# Patient Record
Sex: Male | Born: 1965 | Race: Black or African American | Hispanic: No | Marital: Single | Smoking: Never smoker
Health system: Southern US, Community
[De-identification: ages and names within clinical notes are randomized; demographics above are authoritative.]

## PROBLEM LIST (undated history)

## (undated) DIAGNOSIS — E119 Type 2 diabetes mellitus without complications: Secondary | ICD-10-CM

## (undated) DIAGNOSIS — I10 Essential (primary) hypertension: Secondary | ICD-10-CM

## (undated) DIAGNOSIS — G4733 Obstructive sleep apnea (adult) (pediatric): Secondary | ICD-10-CM

## (undated) DIAGNOSIS — Z9989 Dependence on other enabling machines and devices: Secondary | ICD-10-CM

---

## 1999-10-24 ENCOUNTER — Ambulatory Visit (HOSPITAL_BASED_OUTPATIENT_CLINIC_OR_DEPARTMENT_OTHER): Admission: RE | Admit: 1999-10-24 | Discharge: 1999-10-24 | Payer: Self-pay | Admitting: Orthopedic Surgery

## 2010-03-02 ENCOUNTER — Emergency Department (HOSPITAL_COMMUNITY): Admission: EM | Admit: 2010-03-02 | Discharge: 2010-03-02 | Payer: Self-pay | Admitting: Emergency Medicine

## 2010-12-19 NOTE — Op Note (Signed)
[place]  Patient:    Adrian Farrell, Adrian Farrell                 MRN: [id number] Proc. Date: 10/24/99 Adm. Date:  [id number] Attending:  Ronne Binning CC:         Nicki Reaper, M.D. (2 copies)                           Operative Report  PREOPERATIVE DIAGNOSIS:  Laceration extensor tendon left index finger.  POSTOPERATIVE DIAGNOSIS:  Laceration extensor tendon left index finger.  OPERATION:  Repair of extensor tendon left index finger.  SURGEON:  Nicki Reaper, M.D.  ANESTHESIA:  Metacarpal block.  HISTORY:  The patient is a 44 year old male who suffered a laceration to the dorsal aspect of the left index finger.  This was repaired [facility] only at [facility]. He is referred for definitive care.  PROCEDURE:  The patient was brought to the operating room.  A metacarpal block given with 1% Xylocaine without epinephrine.  Prepped and draped using Betadine  scrubbing solution.  Penrose drain was used for tourniquet control at the base f the finger.  The sutures were removed.  The laceration to the extensor tendon was immediately apparent.  This was repaired with figure-of-eight 4 0 Mersilene sutures.  The wound was irrigated.  The skin was closed with uninterrupted 5 0 nylon sutures.  A sterile compressive dressing and splint to the finger applied. The patient tolerated the procedure well and was taken to the recovery room for  observation in satisfactory condition.  He is discharged home to return to [facility] in one week on Vicodin and Keflex.  It should be noted that the laceration was just distal to the central ______ insertion on the radial lateral band and a portion of the central ______. DD:  10/24/99 TD:  10/24/99 Job: [id number]

## 2012-08-24 ENCOUNTER — Encounter (HOSPITAL_COMMUNITY): Payer: Self-pay | Admitting: *Deleted

## 2012-08-24 ENCOUNTER — Inpatient Hospital Stay (HOSPITAL_COMMUNITY)
Admission: EM | Admit: 2012-08-24 | Discharge: 2012-08-29 | DRG: 617 | Disposition: A | Discharge status: Home / Self Care | Payer: No Typology Code available for payment source | Attending: Family Medicine | Admitting: Family Medicine

## 2012-08-24 ENCOUNTER — Emergency Department (HOSPITAL_COMMUNITY): Payer: Self-pay

## 2012-08-24 DIAGNOSIS — I96 Gangrene, not elsewhere classified: Secondary | ICD-10-CM | POA: Diagnosis present

## 2012-08-24 DIAGNOSIS — L03116 Cellulitis of left lower limb: Secondary | ICD-10-CM

## 2012-08-24 DIAGNOSIS — T783XXA Angioneurotic edema, initial encounter: Secondary | ICD-10-CM | POA: Diagnosis present

## 2012-08-24 DIAGNOSIS — E11621 Type 2 diabetes mellitus with foot ulcer: Secondary | ICD-10-CM | POA: Diagnosis present

## 2012-08-24 DIAGNOSIS — T46905A Adverse effect of unspecified agents primarily affecting the cardiovascular system, initial encounter: Secondary | ICD-10-CM | POA: Diagnosis present

## 2012-08-24 DIAGNOSIS — M9080 Osteopathy in diseases classified elsewhere, unspecified site: Secondary | ICD-10-CM | POA: Diagnosis present

## 2012-08-24 DIAGNOSIS — E1159 Type 2 diabetes mellitus with other circulatory complications: Secondary | ICD-10-CM | POA: Diagnosis present

## 2012-08-24 DIAGNOSIS — Z6841 Body Mass Index (BMI) 40.0 and over, adult: Secondary | ICD-10-CM

## 2012-08-24 DIAGNOSIS — L97509 Non-pressure chronic ulcer of other part of unspecified foot with unspecified severity: Secondary | ICD-10-CM | POA: Diagnosis present

## 2012-08-24 DIAGNOSIS — E1169 Type 2 diabetes mellitus with other specified complication: Principal | ICD-10-CM | POA: Diagnosis present

## 2012-08-24 DIAGNOSIS — I10 Essential (primary) hypertension: Secondary | ICD-10-CM | POA: Diagnosis present

## 2012-08-24 DIAGNOSIS — Z01810 Encounter for preprocedural cardiovascular examination: Secondary | ICD-10-CM

## 2012-08-24 DIAGNOSIS — M869 Osteomyelitis, unspecified: Secondary | ICD-10-CM | POA: Diagnosis present

## 2012-08-24 DIAGNOSIS — I4891 Unspecified atrial fibrillation: Secondary | ICD-10-CM | POA: Diagnosis present

## 2012-08-24 DIAGNOSIS — T464X5A Adverse effect of angiotensin-converting-enzyme inhibitors, initial encounter: Secondary | ICD-10-CM

## 2012-08-24 HISTORY — DX: Essential (primary) hypertension: I10

## 2012-08-24 HISTORY — DX: Type 2 diabetes mellitus without complications: E11.9

## 2012-08-24 HISTORY — DX: Dependence on other enabling machines and devices: Z99.89

## 2012-08-24 HISTORY — DX: Obstructive sleep apnea (adult) (pediatric): G47.33

## 2012-08-24 LAB — CBC WITH DIFFERENTIAL/PLATELET
Basophils Absolute: 0.1 10*3/uL (ref 0.0–0.1)
Basophils Relative: 0 % (ref 0–1)
HCT: 30.9 % — ABNORMAL LOW (ref 39.0–52.0)
Hemoglobin: 10.2 g/dL — ABNORMAL LOW (ref 13.0–17.0)
Lymphocytes Relative: 17 % (ref 12–46)
MCH: 25.9 pg — ABNORMAL LOW (ref 26.0–34.0)
MCHC: 33 g/dL (ref 30.0–36.0)
Monocytes Absolute: 1 10*3/uL (ref 0.1–1.0)
Platelets: 645 10*3/uL — ABNORMAL HIGH (ref 150–400)
RDW: 14.4 % (ref 11.5–15.5)

## 2012-08-24 LAB — GLUCOSE, CAPILLARY: Glucose-Capillary: 160 mg/dL — ABNORMAL HIGH (ref 70–99)

## 2012-08-24 MED ORDER — PIPERACILLIN-TAZOBACTAM 3.375 G IVPB
3.3750 g | Freq: Once | INTRAVENOUS | Status: AC
  Administered 2012-08-25: 3.375 g via INTRAVENOUS
  Filled 2012-08-24: qty 50

## 2012-08-24 MED ORDER — DIPHENHYDRAMINE HCL 50 MG/ML IJ SOLN
25.0000 mg | Freq: Once | INTRAMUSCULAR | Status: AC
  Administered 2012-08-24: 25 mg via INTRAVENOUS
  Filled 2012-08-24: qty 1

## 2012-08-24 MED ORDER — METHYLPREDNISOLONE SODIUM SUCC 125 MG IJ SOLR
125.0000 mg | Freq: Once | INTRAMUSCULAR | Status: AC
  Administered 2012-08-24: 125 mg via INTRAVENOUS
  Filled 2012-08-24: qty 2

## 2012-08-24 MED ORDER — FAMOTIDINE IN NACL 20-0.9 MG/50ML-% IV SOLN
20.0000 mg | Freq: Once | INTRAVENOUS | Status: AC
  Administered 2012-08-24: 20 mg via INTRAVENOUS
  Filled 2012-08-24: qty 50

## 2012-08-24 MED ORDER — SODIUM CHLORIDE 0.9 % IV BOLUS (SEPSIS)
500.0000 mL | Freq: Once | INTRAVENOUS | Status: AC
  Administered 2012-08-24: 500 mL via INTRAVENOUS

## 2012-08-24 MED ORDER — VANCOMYCIN HCL IN DEXTROSE 1-5 GM/200ML-% IV SOLN
1000.0000 mg | Freq: Once | INTRAVENOUS | Status: AC
  Administered 2012-08-25: 1000 mg via INTRAVENOUS
  Filled 2012-08-24: qty 200

## 2012-08-24 NOTE — ED Notes (Signed)
Pt presents with large amt of swelling to top lip; started this afternoon; no tongue swelling; no respiratory involvement; speaking in full sentences without hoarseness; took last dose of lisinopril this morning

## 2012-08-24 NOTE — ED Notes (Signed)
Pt wife to triage nurse in hallway stating pt does not want nurse or doctor to know about foot ulcer; wife has pictures of pt foot dating 08/07/12 showing ulcerated area at base of toes with redness with toenail that has fallen off; [employer] notified

## 2012-08-24 NOTE — ED Provider Notes (Signed)
History     CSN: [id number]  Arrival date & time 08/24/12  2145   First MD Initiated Contact with Patient 08/24/12 2242      Chief Complaint  Patient presents with  . Allergic Reaction  . foot wound     (Consider location/radiation/quality/duration/timing/severity/associated sxs/prior treatment) HPI Pt with upper lip swelling starting at 1500 today. Pt thinks swelling has worsened since. No tongue swelling or difficulty breathing. Pt has been on Lisinopril for many years.   Pt has also had breakdown of skin between toes of L foot with purulent drainage, increased redness, warmth and swelling up leg to L knee. No fever or chills. No N/V. States his diabetes is well controlled.  Past Medical History  Diagnosis Date  . Hypertension   . Coronary artery disease   . Diabetes mellitus without complication     has complication as of 1/23, diabetic foot ulcer  . OSA on CPAP     History reviewed. No pertinent past surgical history.  History reviewed. No pertinent family history.  History  Substance Use Topics  . Smoking status: Never Smoker   . Smokeless tobacco: Never Used  . Alcohol Use: No      Review of Systems  Constitutional: Negative for fever, chills and fatigue.  HENT: Positive for facial swelling. Negative for sore throat, trouble swallowing, neck pain, neck stiffness and voice change.   Respiratory: Negative for shortness of breath, wheezing and stridor.   Cardiovascular: Negative for chest pain.  Gastrointestinal: Negative for nausea, vomiting, abdominal pain and diarrhea.  Musculoskeletal: Negative for myalgias and back pain.  Skin: Positive for color change and rash.  Neurological: Negative for dizziness, weakness, light-headedness, numbness and headaches.  All other systems reviewed and are negative.    Allergies  Lisinopril  Home Medications   No current outpatient prescriptions on file.  BP 119/95  Pulse 74  Temp 98.6 F (37 C) (Oral)  Resp 20   Ht 6\' 1"  (1.854 m)  Wt 383 lb (173.728 kg)  BMI 50.53 kg/m2  SpO2 99%  Physical Exam  Nursing note and vitals reviewed. Constitutional: He is oriented to person, place, and time. He appears well-developed and well-nourished. No distress.  HENT:  Head: Normocephalic and atraumatic.  Mouth/Throat: Oropharynx is clear and moist. No posterior oropharyngeal edema.       L upper lip swelling. No tongue or intraoral swelling  Eyes: EOM are normal. Pupils are equal, round, and reactive to light.  Neck: Normal range of motion. Neck supple.  Cardiovascular: Normal rate and regular rhythm.   Pulmonary/Chest: Effort normal and breath sounds normal. No stridor. No respiratory distress. He has no wheezes. He has no rales. He exhibits no tenderness.  Abdominal: Soft. Bowel sounds are normal. He exhibits no mass. There is no tenderness. There is no rebound and no guarding.  Musculoskeletal: Normal range of motion. He exhibits edema. He exhibits no tenderness.       Skin breakdown between toes of L foot with purulent drainage. +swelling of L leg and redness and warmth extending up to knee.   Neurological: He is alert and oriented to person, place, and time.       Moves all ext, sensation grossly intact.   Skin: Skin is warm and dry. No rash noted. There is erythema.  Psychiatric: He has a normal mood and affect. His behavior is normal.    ED Course  Procedures (including critical care time)  Labs Reviewed  CBC WITH DIFFERENTIAL - Abnormal;  Notable for the following:    WBC 14.1 (*)     RBC 3.94 (*)     Hemoglobin 10.2 (*)     HCT 30.9 (*)     MCH 25.9 (*)     Platelets 645 (*)     Neutro Abs 10.3 (*)     All other components within normal limits  COMPREHENSIVE METABOLIC PANEL - Abnormal; Notable for the following:    Sodium 132 (*)     Glucose, Bld 165 (*)     Albumin 2.3 (*)     AST 43 (*)     ALT 95 (*)     Total Bilirubin 0.2 (*)     GFR calc non Af Amer 72 (*)     GFR calc Af Amer  83 (*)     All other components within normal limits  GLUCOSE, CAPILLARY - Abnormal; Notable for the following:    Glucose-Capillary 160 (*)     All other components within normal limits  CBC - Abnormal; Notable for the following:    WBC 15.3 (*)     RBC 4.01 (*)     Hemoglobin 10.1 (*)     HCT 31.3 (*)     MCH 25.2 (*)     Platelets 652 (*)     All other components within normal limits  BASIC METABOLIC PANEL - Abnormal; Notable for the following:    Sodium 129 (*)     Glucose, Bld 340 (*)     All other components within normal limits  GLUCOSE, CAPILLARY - Abnormal; Notable for the following:    Glucose-Capillary 310 (*)     All other components within normal limits  GLUCOSE, CAPILLARY - Abnormal; Notable for the following:    Glucose-Capillary 304 (*)     All other components within normal limits  HEMOGLOBIN A1C - Abnormal; Notable for the following:    Hemoglobin A1C 9.1 (*)     Mean Plasma Glucose 214 (*)     All other components within normal limits  GLUCOSE, CAPILLARY - Abnormal; Notable for the following:    Glucose-Capillary 265 (*)     All other components within normal limits  CULTURE, BLOOD (ROUTINE X 2)  CULTURE, BLOOD (ROUTINE X 2)   Dg Foot Complete Left  08/24/2012  *RADIOLOGY REPORT*  Clinical Data: Ulcerations at the top of the left foot.  LEFT FOOT - COMPLETE 3+ VIEW  Comparison: None.  Findings: Three views of the left foot were obtained.  There is lucency and bone loss in the fourth toe proximal phalanx.  There is also marked lucency involving the fourth toe middle phalanx.  There appears to be a dislocation or subluxation involving the fourth toe PIP joint.  The fourth toe is poorly visualized on the lateral view.  There is a prominent spur involving the plantar aspect of the calcaneus.  There appears to be soft tissue edema within the foot.  IMPRESSION: There is lucency and bone loss involving the fourth toe proximal phalanx and middle phalanx.  There is  probably a dislocation at the fourth toe PIP joint.  These findings are concerning for osteomyelitis.   Original Report Authenticated By: Richarda Overlie, M.D.      1. ACE inhibitor-aggravated angioedema   2. Osteomyelitis of toe of left foot   3. Cellulitis of left lower leg   4. Diabetic foot ulcer with osteomyelitis       MDM  Loren Racer, MD 08/25/12 1459

## 2012-08-24 NOTE — ED Provider Notes (Signed)
Care assumed from Dr. Ranae Palms. Patient with history of diabetes, hypertension, coronary disease. Patient with angioedema to upper lip, is on lisinopril. No prior history of angioedema no oropharynx involvement. Patient also with wound to left fourth toe with associated cellulitis to midcalf. Labs and x-ray were pending. X-ray shows osteomyelitis. Patient receiving antibiotics. Will need admission.  Results for orders placed during the hospital encounter of 08/24/12  CBC WITH DIFFERENTIAL      Component Value Range   WBC 14.1 (*) 4.0 - 10.5 K/uL   RBC 3.94 (*) 4.22 - 5.81 MIL/uL   Hemoglobin 10.2 (*) 13.0 - 17.0 g/dL   HCT 16.1 (*) 09.6 - 04.5 %   MCV 78.4  78.0 - 100.0 fL   MCH 25.9 (*) 26.0 - 34.0 pg   MCHC 33.0  30.0 - 36.0 g/dL   RDW 40.9  81.1 - 91.4 %   Platelets 645 (*) 150 - 400 K/uL   Neutrophils Relative 73  43 - 77 %   Neutro Abs 10.3 (*) 1.7 - 7.7 K/uL   Lymphocytes Relative 17  12 - 46 %   Lymphs Abs 2.4  0.7 - 4.0 K/uL   Monocytes Relative 7  3 - 12 %   Monocytes Absolute 1.0  0.1 - 1.0 K/uL   Eosinophils Relative 2  0 - 5 %   Eosinophils Absolute 0.3  0.0 - 0.7 K/uL   Basophils Relative 0  0 - 1 %   Basophils Absolute 0.1  0.0 - 0.1 K/uL  COMPREHENSIVE METABOLIC PANEL      Component Value Range   Sodium 132 (*) 135 - 145 mEq/L   Potassium 3.7  3.5 - 5.1 mEq/L   Chloride 97  96 - 112 mEq/L   CO2 24  19 - 32 mEq/L   Glucose, Bld 165 (*) 70 - 99 mg/dL   BUN 13  6 - 23 mg/dL   Creatinine, Ser 7.82  0.50 - 1.35 mg/dL   Calcium 8.8  8.4 - 95.6 mg/dL   Total Protein 7.2  6.0 - 8.3 g/dL   Albumin 2.3 (*) 3.5 - 5.2 g/dL   AST 43 (*) 0 - 37 U/L   ALT 95 (*) 0 - 53 U/L   Alkaline Phosphatase 110  39 - 117 U/L   Total Bilirubin 0.2 (*) 0.3 - 1.2 mg/dL   GFR calc non Af Amer 72 (*) >90 mL/min   GFR calc Af Amer 83 (*) >90 mL/min  GLUCOSE, CAPILLARY      Component Value Range   Glucose-Capillary 160 (*) 70 - 99 mg/dL   Comment 1 Documented in Chart     Comment 2 Notify  RN    CBC      Component Value Range   WBC 15.3 (*) 4.0 - 10.5 K/uL   RBC 4.01 (*) 4.22 - 5.81 MIL/uL   Hemoglobin 10.1 (*) 13.0 - 17.0 g/dL   HCT 21.3 (*) 08.6 - 57.8 %   MCV 78.1  78.0 - 100.0 fL   MCH 25.2 (*) 26.0 - 34.0 pg   MCHC 32.3  30.0 - 36.0 g/dL   RDW 46.9  62.9 - 52.8 %   Platelets 652 (*) 150 - 400 K/uL  BASIC METABOLIC PANEL      Component Value Range   Sodium 129 (*) 135 - 145 mEq/L   Potassium 4.9  3.5 - 5.1 mEq/L   Chloride 96  96 - 112 mEq/L   CO2 21  19 -  32 mEq/L   Glucose, Bld 340 (*) 70 - 99 mg/dL   BUN 15  6 - 23 mg/dL   Creatinine, Ser 6.21  0.50 - 1.35 mg/dL   Calcium 8.9  8.4 - 30.8 mg/dL   GFR calc non Af Amer >90  >90 mL/min   GFR calc Af Amer >90  >90 mL/min   Dg Foot Complete Left  08/24/2012  *RADIOLOGY REPORT*  Clinical Data: Ulcerations at the top of the left foot.  LEFT FOOT - COMPLETE 3+ VIEW  Comparison: None.  Findings: Three views of the left foot were obtained.  There is lucency and bone loss in the fourth toe proximal phalanx.  There is also marked lucency involving the fourth toe middle phalanx.  There appears to be a dislocation or subluxation involving the fourth toe PIP joint.  The fourth toe is poorly visualized on the lateral view.  There is a prominent spur involving the plantar aspect of the calcaneus.  There appears to be soft tissue edema within the foot.  IMPRESSION: There is lucency and bone loss involving the fourth toe proximal phalanx and middle phalanx.  There is probably a dislocation at the fourth toe PIP joint.  These findings are concerning for osteomyelitis.   Original Report Authenticated By: Richarda Overlie, M.D.       Olivia Mackie, MD 08/25/12 [phone]

## 2012-08-25 ENCOUNTER — Encounter (HOSPITAL_COMMUNITY)
Admission: EM | Disposition: A | Discharge status: Home / Self Care | Payer: Self-pay | Source: Home / Self Care | Attending: Internal Medicine

## 2012-08-25 ENCOUNTER — Encounter (HOSPITAL_COMMUNITY): Payer: Self-pay | Admitting: Internal Medicine

## 2012-08-25 DIAGNOSIS — T783XXA Angioneurotic edema, initial encounter: Secondary | ICD-10-CM

## 2012-08-25 DIAGNOSIS — E11621 Type 2 diabetes mellitus with foot ulcer: Secondary | ICD-10-CM | POA: Diagnosis present

## 2012-08-25 DIAGNOSIS — T465X5A Adverse effect of other antihypertensive drugs, initial encounter: Secondary | ICD-10-CM

## 2012-08-25 DIAGNOSIS — I96 Gangrene, not elsewhere classified: Secondary | ICD-10-CM

## 2012-08-25 LAB — COMPREHENSIVE METABOLIC PANEL
Albumin: 2.3 g/dL — ABNORMAL LOW (ref 3.5–5.2)
BUN: 13 mg/dL (ref 6–23)
CO2: 24 mEq/L (ref 19–32)
Calcium: 8.8 mg/dL (ref 8.4–10.5)
Chloride: 97 mEq/L (ref 96–112)
GFR calc Af Amer: 83 mL/min — ABNORMAL LOW (ref >90)
GFR calc non Af Amer: 72 mL/min — ABNORMAL LOW (ref >90)
Glucose, Bld: 165 mg/dL — ABNORMAL HIGH (ref 70–99)
Potassium: 3.7 mEq/L (ref 3.5–5.1)

## 2012-08-25 LAB — GLUCOSE, CAPILLARY
Glucose-Capillary: 310 mg/dL — ABNORMAL HIGH (ref 70–99)
Glucose-Capillary: 304 mg/dL — ABNORMAL HIGH (ref 70–99)

## 2012-08-25 LAB — CBC
MCH: 25.2 pg — ABNORMAL LOW (ref 26.0–34.0)
Platelets: 652 10*3/uL — ABNORMAL HIGH (ref 150–400)
RBC: 4.01 MIL/uL — ABNORMAL LOW (ref 4.22–5.81)

## 2012-08-25 LAB — BASIC METABOLIC PANEL
CO2: 21 mEq/L (ref 19–32)
Calcium: 8.9 mg/dL (ref 8.4–10.5)
GFR calc non Af Amer: >90 mL/min (ref >90)
Potassium: 4.9 mEq/L (ref 3.5–5.1)
Sodium: 129 mEq/L — ABNORMAL LOW (ref 135–145)

## 2012-08-25 SURGERY — AMPUTATION DIGIT
Anesthesia: General | Site: Toe | Laterality: Left

## 2012-08-25 MED ORDER — DILTIAZEM HCL 25 MG/5ML IV SOLN
10.0000 mg | Freq: Three times a day (TID) | INTRAVENOUS | Status: DC | PRN
  Administered 2012-08-25: 10 mg via INTRAVENOUS
  Filled 2012-08-25: qty 5

## 2012-08-25 MED ORDER — POTASSIUM GLUCONATE 595 (99 K) MG PO TABS
595.0000 mg | ORAL_TABLET | Freq: Every day | ORAL | Status: DC
  Administered 2012-08-25 – 2012-08-26 (×2): 595 mg via ORAL
  Filled 2012-08-25 (×2): qty 1

## 2012-08-25 MED ORDER — VANCOMYCIN HCL 10 G IV SOLR
1500.0000 mg | Freq: Two times a day (BID) | INTRAVENOUS | Status: DC
  Administered 2012-08-25 – 2012-08-27 (×6): 1500 mg via INTRAVENOUS
  Filled 2012-08-25 (×8): qty 1500

## 2012-08-25 MED ORDER — INSULIN ASPART PROT & ASPART (70-30 MIX) 100 UNIT/ML ~~LOC~~ SUSP
40.0000 [IU] | Freq: Two times a day (BID) | SUBCUTANEOUS | Status: DC
  Administered 2012-08-25 – 2012-08-29 (×7): 40 [IU] via SUBCUTANEOUS
  Filled 2012-08-25: qty 3

## 2012-08-25 MED ORDER — INSULIN ASPART 100 UNIT/ML ~~LOC~~ SOLN
0.0000 [IU] | Freq: Three times a day (TID) | SUBCUTANEOUS | Status: DC
  Administered 2012-08-25: 8 [IU] via SUBCUTANEOUS
  Administered 2012-08-26: 11 [IU] via SUBCUTANEOUS
  Administered 2012-08-26: 3 [IU] via SUBCUTANEOUS
  Administered 2012-08-27: 8 [IU] via SUBCUTANEOUS
  Administered 2012-08-27: 2 [IU] via SUBCUTANEOUS
  Administered 2012-08-28 (×3): 3 [IU] via SUBCUTANEOUS
  Administered 2012-08-29: 2 [IU] via SUBCUTANEOUS
  Administered 2012-08-29: 8 [IU] via SUBCUTANEOUS
  Filled 2012-08-25 (×2): qty 1

## 2012-08-25 MED ORDER — METFORMIN HCL 500 MG PO TABS
500.0000 mg | ORAL_TABLET | Freq: Two times a day (BID) | ORAL | Status: DC
  Administered 2012-08-25: 500 mg via ORAL
  Filled 2012-08-25 (×5): qty 1

## 2012-08-25 MED ORDER — DILTIAZEM HCL 100 MG IV SOLR
5.0000 mg/h | INTRAVENOUS | Status: DC
  Administered 2012-08-25: 10 mg/h via INTRAVENOUS
  Filled 2012-08-25 (×2): qty 100

## 2012-08-25 MED ORDER — HEPARIN SODIUM (PORCINE) 5000 UNIT/ML IJ SOLN
5000.0000 [IU] | Freq: Three times a day (TID) | INTRAMUSCULAR | Status: DC
  Administered 2012-08-25 – 2012-08-29 (×12): 5000 [IU] via SUBCUTANEOUS
  Filled 2012-08-25 (×17): qty 1

## 2012-08-25 MED ORDER — METOPROLOL TARTRATE 12.5 MG HALF TABLET: 12.5000 mg | ORAL_TABLET | Freq: Two times a day (BID) | ORAL | Status: DC

## 2012-08-25 MED ORDER — POTASSIUM GLUCONATE 550 MG PO TABS: 1.0000 | ORAL_TABLET | Freq: Every day | ORAL | Status: DC

## 2012-08-25 MED ORDER — DILTIAZEM LOAD VIA INFUSION
10.0000 mg | Freq: Once | INTRAVENOUS | Status: AC
  Administered 2012-08-25: 10 mg via INTRAVENOUS
  Filled 2012-08-25: qty 10

## 2012-08-25 MED ORDER — PIPERACILLIN-TAZOBACTAM 3.375 G IVPB
3.3750 g | Freq: Three times a day (TID) | INTRAVENOUS | Status: DC
  Administered 2012-08-25 – 2012-08-28 (×8): 3.375 g via INTRAVENOUS
  Filled 2012-08-25 (×12): qty 50

## 2012-08-25 MED ORDER — FUROSEMIDE 40 MG PO TABS
40.0000 mg | ORAL_TABLET | Freq: Every day | ORAL | Status: DC
  Administered 2012-08-25 – 2012-08-29 (×5): 40 mg via ORAL
  Filled 2012-08-25 (×5): qty 1

## 2012-08-25 MED ORDER — INSULIN ASPART 100 UNIT/ML ~~LOC~~ SOLN
0.0000 [IU] | Freq: Once | SUBCUTANEOUS | Status: AC
  Administered 2012-08-25: 11 [IU] via SUBCUTANEOUS

## 2012-08-25 MED ORDER — METOPROLOL TARTRATE 12.5 MG HALF TABLET
12.5000 mg | ORAL_TABLET | Freq: Two times a day (BID) | ORAL | Status: DC
  Administered 2012-08-25 – 2012-08-26 (×2): 12.5 mg via ORAL
  Filled 2012-08-25 (×4): qty 1

## 2012-08-25 MED ORDER — LORAZEPAM 2 MG/ML IJ SOLN: 1.0000 mg | INTRAMUSCULAR | Status: DC | PRN

## 2012-08-25 MED ORDER — PIPERACILLIN-TAZOBACTAM 3.375 G IVPB: 3.3750 g | Freq: Four times a day (QID) | INTRAVENOUS | Status: DC

## 2012-08-25 MED ORDER — POTASSIUM GLUCONATE 550 MG PO TABS
1.0000 | ORAL_TABLET | Freq: Every day | ORAL | Status: DC
  Filled 2012-08-25: qty 1

## 2012-08-25 NOTE — Consult Note (Signed)
Reason for Consult:left foot toe infection / osteomyelitis Referring Physician:Gardner  Adrian Farrell is an 47 y.o. male.  HPI: 47 year old male with skin breakdown of left 3rd thru 5th toes for 2 weeks which we were consulted for by Dr. Julian Reil. Patient reports DM II for 15 years . Last meal at noon today.  Past Medical History  Diagnosis Date  . Hypertension   . Coronary artery disease   . Diabetes mellitus without complication     has complication as of 1/23, diabetic foot ulcer  . OSA on CPAP     History reviewed. No pertinent past surgical history.  History reviewed. No pertinent family history.  Social History:  reports that he has never smoked. He has never used smokeless tobacco. He reports that he does not drink alcohol or use illicit drugs.  Allergies:  Allergies  Allergen Reactions  . Lisinopril Swelling    Angioedema    Medications: I have reviewed the patient's current medications.  Results for orders placed during the hospital encounter of 08/24/12 (from the past 48 hour(s))  CBC WITH DIFFERENTIAL     Status: Abnormal   Collection Time   08/24/12 10:52 PM      Component Value Range Comment   WBC 14.1 (*) 4.0 - 10.5 K/uL    RBC 3.94 (*) 4.22 - 5.81 MIL/uL    Hemoglobin 10.2 (*) 13.0 - 17.0 g/dL    HCT 16.1 (*) 09.6 - 52.0 %    MCV 78.4  78.0 - 100.0 fL    MCH 25.9 (*) 26.0 - 34.0 pg    MCHC 33.0  30.0 - 36.0 g/dL    RDW 04.5  40.9 - 81.1 %    Platelets 645 (*) 150 - 400 K/uL    Neutrophils Relative 73  43 - 77 %    Neutro Abs 10.3 (*) 1.7 - 7.7 K/uL    Lymphocytes Relative 17  12 - 46 %    Lymphs Abs 2.4  0.7 - 4.0 K/uL    Monocytes Relative 7  3 - 12 %    Monocytes Absolute 1.0  0.1 - 1.0 K/uL    Eosinophils Relative 2  0 - 5 %    Eosinophils Absolute 0.3  0.0 - 0.7 K/uL    Basophils Relative 0  0 - 1 %    Basophils Absolute 0.1  0.0 - 0.1 K/uL   COMPREHENSIVE METABOLIC PANEL     Status: Abnormal   Collection Time   08/24/12 10:52 PM   Component Value Range Comment   Sodium 132 (*) 135 - 145 mEq/L    Potassium 3.7  3.5 - 5.1 mEq/L    Chloride 97  96 - 112 mEq/L    CO2 24  19 - 32 mEq/L    Glucose, Bld 165 (*) 70 - 99 mg/dL    BUN 13  6 - 23 mg/dL    Creatinine, Ser 9.14  0.50 - 1.35 mg/dL    Calcium 8.8  8.4 - 78.2 mg/dL    Total Protein 7.2  6.0 - 8.3 g/dL    Albumin 2.3 (*) 3.5 - 5.2 g/dL    AST 43 (*) 0 - 37 U/L    ALT 95 (*) 0 - 53 U/L    Alkaline Phosphatase 110  39 - 117 U/L    Total Bilirubin 0.2 (*) 0.3 - 1.2 mg/dL    GFR calc non Af Amer 72 (*) >90 mL/min    GFR calc  Af Amer 83 (*) >90 mL/min   GLUCOSE, CAPILLARY     Status: Abnormal   Collection Time   08/24/12 11:52 PM      Component Value Range Comment   Glucose-Capillary 160 (*) 70 - 99 mg/dL    Comment 1 Documented in Chart      Comment 2 Notify RN     CBC     Status: Abnormal   Collection Time   08/25/12  5:09 AM      Component Value Range Comment   WBC 15.3 (*) 4.0 - 10.5 K/uL    RBC 4.01 (*) 4.22 - 5.81 MIL/uL    Hemoglobin 10.1 (*) 13.0 - 17.0 g/dL    HCT 16.1 (*) 09.6 - 52.0 %    MCV 78.1  78.0 - 100.0 fL    MCH 25.2 (*) 26.0 - 34.0 pg    MCHC 32.3  30.0 - 36.0 g/dL    RDW 04.5  40.9 - 81.1 %    Platelets 652 (*) 150 - 400 K/uL   BASIC METABOLIC PANEL     Status: Abnormal   Collection Time   08/25/12  5:09 AM      Component Value Range Comment   Sodium 129 (*) 135 - 145 mEq/L    Potassium 4.9  3.5 - 5.1 mEq/L    Chloride 96  96 - 112 mEq/L    CO2 21  19 - 32 mEq/L    Glucose, Bld 340 (*) 70 - 99 mg/dL    BUN 15  6 - 23 mg/dL    Creatinine, Ser 9.14  0.50 - 1.35 mg/dL    Calcium 8.9  8.4 - 78.2 mg/dL    GFR calc non Af Amer >90  >90 mL/min    GFR calc Af Amer >90  >90 mL/min   HEMOGLOBIN A1C     Status: Abnormal   Collection Time   08/25/12  5:09 AM      Component Value Range Comment   Hemoglobin A1C 9.1 (*) <5.7 %    Mean Plasma Glucose 214 (*) <117 mg/dL   GLUCOSE, CAPILLARY     Status: Abnormal   Collection Time   08/25/12   6:41 AM      Component Value Range Comment   Glucose-Capillary 310 (*) 70 - 99 mg/dL   GLUCOSE, CAPILLARY     Status: Abnormal   Collection Time   08/25/12  8:44 AM      Component Value Range Comment   Glucose-Capillary 304 (*) 70 - 99 mg/dL    Comment 1 Documented in Chart      Comment 2 Notify RN     GLUCOSE, CAPILLARY     Status: Abnormal   Collection Time   08/25/12 12:14 PM      Component Value Range Comment   Glucose-Capillary 265 (*) 70 - 99 mg/dL     Dg Foot Complete Left  08/24/2012  *RADIOLOGY REPORT*  Clinical Data: Ulcerations at the top of the left foot.  LEFT FOOT - COMPLETE 3+ VIEW  Comparison: None.  Findings: Three views of the left foot were obtained.  There is lucency and bone loss in the fourth toe proximal phalanx.  There is also marked lucency involving the fourth toe middle phalanx.  There appears to be a dislocation or subluxation involving the fourth toe PIP joint.  The fourth toe is poorly visualized on the lateral view.  There is a prominent spur involving the plantar aspect of the  calcaneus.  There appears to be soft tissue edema within the foot.  IMPRESSION: There is lucency and bone loss involving the fourth toe proximal phalanx and middle phalanx.  There is probably a dislocation at the fourth toe PIP joint.  These findings are concerning for osteomyelitis.   Original Report Authenticated By: Richarda Overlie, M.D.     Review of Systems  Constitutional: Negative.   HENT: Negative.   Eyes: Negative.   Respiratory: Negative.   Cardiovascular: Negative.   Gastrointestinal: Negative.   Genitourinary: Negative.   Skin:       Skin break down between left 3rd,4th and 5th toes  Psychiatric/Behavioral: Negative.    Blood pressure 119/95, pulse 74, temperature 98.6 F (37 C), temperature source Oral, resp. rate 20, height 6\' 1"  (1.854 m), weight 173.728 kg (383 lb), SpO2 99.00%. Physical Exam  Constitutional: He is oriented to person, place, and time. He appears  well-developed and well-nourished.  HENT:  Head: Normocephalic.  Eyes: EOM are normal.  Cardiovascular: Normal rate and intact distal pulses.        Venous changes bilateral lower legs left greater than and right.   Respiratory: Effort normal.  Neurological: He is alert and oriented to person, place, and time.       Left foot sensation grossly intact to light touch  Skin:       Maceration between 3rd and 4th  web spaces . Purulent drainage around 4th toe. Ulceration medial aspect of left 5th toe no drainage. Mid left leg with lateral ulceration with serous drainage. Erythema and warmth left lower leg.    Assessment/Plan: Diabetes II for 15 yrs  Years with Hgb A1c of 9.1. Left foot skin breakdown for 2 weeks now with osteomyelitis 4th toe on radiographs. Currently on Zosyn and vancomycin. Plan amputation of Left 4th toe thru MTP joint later today by DR.Lestine Box. Patient seen and evaluated by Dr. Lestine Box. NPO since 1250 hrs    Richardean Canal 08/25/2012, 2:51 PM

## 2012-08-25 NOTE — Progress Notes (Signed)
Patient seen and admitted earlier this AM by my associate.  Please review the H and P for further details regarding the Assessment and Plan.  This AM I have placed consult to ortho for further recommendations and I would like to thank Dr. Lestine Box for his help and advice regarding this case.  We will f/u with his recommendations.  - ordered ankle brachial index.  Gurney Balthazor  Possible operation this evening.  Patient has had some anxiety as a result of possible operation and as such order will be placed for Ativan.

## 2012-08-25 NOTE — ED Notes (Signed)
Report called to Bernette Redbird, RN, on 3W. Pt and wife aware of transfer to room 1345 and agreeable.  Pt had small amount of purulent drainage from between toes 3-4 and toes 4-5. Cleansed w/ NSS, dried well, and 4x4 gauze placed between toes.

## 2012-08-25 NOTE — Progress Notes (Signed)
VASCULAR LAB PRELIMINARY  ARTERIAL  ABI completed:    RIGHT    LEFT    PRESSURE WAVEFORM  PRESSURE WAVEFORM  BRACHIAL 132 Triphasic BRACHIAL 129 Triphasic  DP 144 Biphasic DP 123 Biphasic         PT 148 Biphasic PT 127 Biphasic                  RIGHT LEFT  ABI 1.12 0.96   ABIs and Doppler waveforms are within normal limits bilaterally at rst.  Sherrol Vicars, RVS 08/25/2012, 3:04 PM

## 2012-08-25 NOTE — H&P (Signed)
[facility] History and Physical  Adrian Farrell OZH:[id number] DOB: March 12, 1966 DOA: 08/24/2012  Referring physician: ED PCP: No primary provider on file.  Specialists: None  Chief Complaint: Angioedema, L foot ulcer  HPI: Adrian Farrell is a 47 y.o. male who presents to the ED with 2 complaints:  The first is upper lip swelling that onset at 1500 today progressively worsening since onset.  This occurs in the context of many years of lisinopril.  There is no associated tongue swelling nor difficulty breathing.  The second issue which he is very reluctant (to the point of apparently trying to hide it) to talk about, is severe breakdown of the skin between the toes of his L foot, diabetic foot ulcer, and apparent gangrene of the toes.  He states this is not painful, further history taken from his wife reveals this has been going on for at least 2 weeks, it occurs in the context of poorly controlled DM2 at home.  In the ED, X ray exam revealed osteomyelitis of the 4th toe.  Hospitalist has been asked to admit the patient, who is very hesitant to further discuss need for toe amputation at this time.  Review of Systems: 12 systems reviewed and otherwise negative.  Past Medical History  Diagnosis Date  . Hypertension   . Coronary artery disease   . Diabetes mellitus without complication    History reviewed. No pertinent past surgical history. Social History:  reports that he has never smoked. He does not have any smokeless tobacco history on file. He reports that he does not drink alcohol. His drug history not on file.   Allergies  Allergen Reactions  . Lisinopril Swelling    Angioedema    No family history on file. Father is alive, will be here to see patient and "talk some sense into him" per patients wife.   Prior to Admission medications   Medication Sig Start Date End Date Taking? Authorizing Provider  furosemide (LASIX) 40 MG tablet Take 40 mg by mouth daily.    Yes Historical Provider, MD  insulin aspart protamine-insulin aspart (NOVOLOG 70/30) (70-30) 100 UNIT/ML injection Inject 40 Units into the skin 2 (two) times daily with a meal.   Yes Historical Provider, MD  metFORMIN (GLUCOPHAGE) 500 MG tablet Take 500 mg by mouth 2 (two) times daily with a meal.   Yes Historical Provider, MD  Potassium Gluconate 550 MG TABS Take 1 tablet by mouth daily.   Yes Historical Provider, MD  sulfamethoxazole-trimethoprim (BACTRIM DS,SEPTRA DS) 800-160 MG per tablet Take 1 tablet by mouth 2 (two) times daily.   Yes Historical Provider, MD   Physical Exam: Filed Vitals:   08/24/12 2203 08/24/12 2354 08/25/12 0053  BP: 137/95 104/59 149/65  Pulse: 71 92 115  Temp: 98.7 F (37.1 C) 98.6 F (37 C)   TempSrc:  Oral   Resp: 20 19 18   SpO2: 98% 100% 98%    General:  NAD, resting comfortably in bed Eyes: PEERLA EOMI ENT: mucous membranes moist, no tongue swelling, has swelling localized to the upper lip Neck: supple w/o JVD Cardiovascular: RRR w/o MRG Respiratory: CTA B Abdomen: soft, nt, nd, bs+ Skin: skin breakdown between toes of L foot, purulent drainage, redness and warmth extending up to the knee, necrosis of the skin over the 3-5th toes with gross gangrene on examination Musculoskeletal: MAE, full ROM all 4 extremities Psychiatric: normal tone and affect Neurologic: AAOx3, grossly non-focal  Labs on Admission:  Basic Metabolic Panel:  Lab 08/24/12 2252  NA 132*  K 3.7  CL 97  CO2 24  GLUCOSE 165*  BUN 13  CREATININE 1.18  CALCIUM 8.8  MG --  PHOS --   Liver Function Tests:  Lab 08/24/12 2252  AST 43*  ALT 95*  ALKPHOS 110  BILITOT 0.2*  PROT 7.2  ALBUMIN 2.3*   No results found for this basename: LIPASE:5,AMYLASE:5 in the last 168 hours No results found for this basename: AMMONIA:5 in the last 168 hours CBC:  Lab 08/24/12 2252  WBC 14.1*  NEUTROABS 10.3*  HGB 10.2*  HCT 30.9*  MCV 78.4  PLT 645*   Cardiac Enzymes: No  results found for this basename: CKTOTAL:5,CKMB:5,CKMBINDEX:5,TROPONINI:5 in the last 168 hours  BNP (last 3 results) No results found for this basename: PROBNP:3 in the last 8760 hours CBG:  Lab 08/24/12 2352  GLUCAP 160*    Radiological Exams on Admission: Dg Foot Complete Left  08/24/2012  *RADIOLOGY REPORT*  Clinical Data: Ulcerations at the top of the left foot.  LEFT FOOT - COMPLETE 3+ VIEW  Comparison: None.  Findings: Three views of the left foot were obtained.  There is lucency and bone loss in the fourth toe proximal phalanx.  There is also marked lucency involving the fourth toe middle phalanx.  There appears to be a dislocation or subluxation involving the fourth toe PIP joint.  The fourth toe is poorly visualized on the lateral view.  There is a prominent spur involving the plantar aspect of the calcaneus.  There appears to be soft tissue edema within the foot.  IMPRESSION: There is lucency and bone loss involving the fourth toe proximal phalanx and middle phalanx.  There is probably a dislocation at the fourth toe PIP joint.  These findings are concerning for osteomyelitis.   Original Report Authenticated By: Richarda Overlie, M.D.     EKG: Independently reviewed.  Assessment/Plan Principal Problem:  *Diabetic foot ulcer with osteomyelitis Active Problems:  Angioedema   1. Diabetic foot ulcer, gangrene, and osteomyelitis - Thankfully other than his WBC elevation no systemic signs nor symptoms of infection, will put on emperic zosyn and vancomycin to prevent systemic spread for now.  Unfortunately healing prognosis seems poor, the patient has frank osteomyelitis of the 4th toe on plain film X-ray, and gross necrotic gangrene of the 3rd, 4th, and 5th toes on exam.  I doubt that there is adequate blood supply to these areas to resolve the infection even with ABX much less wound healing.  Likely needs orthopaedic consultation and (if he consents) debridement / amputation.  Wife has been  trying to get him to come to the hospital for the past 2 weeks, patient does not want to talk about this though he does not specifically forbid me from talking to his wife about this.  Apparently per the wife, she will contact his parents and she feels that his father will be successful in convincing him regarding the need for amputation. 2. Angioedema - continue to monitor as inpatient with continuous pulse ox, have stopped his lisinopril as well and added to allergies list. 3. DM2 - continue home regimen for now with AC/HS CBG checks, likely will need modification but unclear how just yet, on 70/30 and metformin at home.  .  Code Status: Full Code (must indicate code status--if unknown or must be presumed, indicate so) Family Communication: Spoke with wife and patient (patient did not forbid me to talk to wife) (indicate person spoken with, if applicable,  with phone number if by telephone) Disposition Plan: Admit to inpatient (indicate anticipated LOS)  Time spent: 70 min  GARDNER, JARED M. [facility] Pager [phone]  If 7PM-7AM, please contact night-coverage [email or web address] Password [email or web address] 08/25/2012, 1:40 AM

## 2012-08-25 NOTE — Progress Notes (Signed)
ANTIBIOTIC CONSULT NOTE - INITIAL  Pharmacy Consult for Vancomycin Indication: Diabetic Foot ulcer/Osteo 4th toe/Gangrene 3rd, 4th and 5th toe   Allergies  Allergen Reactions  . Lisinopril Swelling    Angioedema    Patient Measurements: Height: 6\' 1"  (185.4 cm) Weight: 378 lb (171.46 kg) IBW/kg (Calculated) : 79.9    Vital Signs: Temp: 98.6 F (37 C) (01/22 2354) Temp src: Oral (01/22 2354) BP: 128/84 mmHg (01/23 0643) Pulse Rate: 76  (01/23 0643) Intake/Output from previous day:   Intake/Output from this shift:    Labs:  [facility] 08/25/12 0509 08/24/12 2252  WBC 15.3* 14.1*  HGB 10.1* 10.2*  PLT 652* 645*  LABCREA -- --  CREATININE 0.93 1.18   Estimated Creatinine Clearance: 161.8 ml/min (by C-G formula based on Cr of 0.93). No results found for this basename: VANCOTROUGH:2,VANCOPEAK:2,VANCORANDOM:2,GENTTROUGH:2,GENTPEAK:2,GENTRANDOM:2,TOBRATROUGH:2,TOBRAPEAK:2,TOBRARND:2,AMIKACINPEAK:2,AMIKACINTROU:2,AMIKACIN:2, in the last 72 hours   Microbiology: No results found for this or any previous visit (from the past 720 hour(s)).  Medical History: Past Medical History  Diagnosis Date  . Hypertension   . Coronary artery disease   . Diabetes mellitus without complication     has complication as of 1/23, diabetic foot ulcer  . OSA on CPAP     Medications:  Scheduled:    . [COMPLETED] diphenhydrAMINE  25 mg Intravenous Once  . furosemide  40 mg Oral Daily  . heparin  5,000 Units Subcutaneous Q8H  . insulin aspart protamine-insulin aspart  40 Units Subcutaneous BID WC  . metFORMIN  500 mg Oral BID WC  . [COMPLETED] methylPREDNISolone (SOLU-MEDROL) injection  125 mg Intravenous Once  . Potassium Gluconate  1 tablet Oral Daily  . [COMPLETED] vancomycin  1,000 mg Intravenous Once  . [DISCONTINUED] Potassium Gluconate  1 tablet Oral Daily   Infusions:    . [COMPLETED] famotidine (PEPCID) IV Stopped (08/25/12 0006)  . [COMPLETED] piperacillin-tazobactam (ZOSYN)   IV Stopped (08/25/12 0126)  . piperacillin-tazobactam (ZOSYN)  IV 3.375 g (08/25/12 0634)  . [COMPLETED] sodium chloride Stopped (08/25/12 0007)  . [DISCONTINUED] piperacillin-tazobactam (ZOSYN)  IV     Assessment: 47 yo male admitted c/o angioedema (on lisinopril many years).  Pt found to have diabetic foot ulcer, gangrene and osteomyelitis.  MD ordering Vancomycin per Rx.  Goal of Therapy:  Vancomycin trough level 15-20 mcg/ml  Plan:   Vancomycin 1500mg  q12h. CrCl~ 79 (N)  Pt had 1Gm ~ 0130 in ER- will go ahead and start maintenance dose @ 0800.  F/U SCr/levels as needed.  Susanne Greenhouse R 08/25/2012,6:50 AM

## 2012-08-25 NOTE — Progress Notes (Signed)
Patient having some anxiety with anticipation of surgery.  Patient's pulse elevated at 140.  When patient offered ativan he stated that he had since calmed down.  Heart rate came down to 89.  Patient told to let staff know, if he stated to feel anxious again.  Philomena Doheny RN

## 2012-08-25 NOTE — ED Notes (Signed)
Admitting MD at bedside.

## 2012-08-25 NOTE — Progress Notes (Signed)
Patient's pre-op ekg showed atrial fibrillation with pulse of 145.  Patient's heart rate when checked manually 90, but irregular.  Dr. Cena Benton notified of findings.  Philomena Doheny RN

## 2012-08-25 NOTE — ED Notes (Signed)
Pt has ulcer around fourth toe. Denies pain. States foot "is so much better than it was yesterday." Foot and calf with scattered discoloration and open lesions. Pt states this is chronic.

## 2012-08-25 NOTE — H&P (Signed)
  No change hx and physical exam.  Consult done on chart.

## 2012-08-26 ENCOUNTER — Inpatient Hospital Stay (HOSPITAL_COMMUNITY): Payer: Self-pay

## 2012-08-26 ENCOUNTER — Encounter (HOSPITAL_COMMUNITY): Payer: Self-pay | Admitting: *Deleted

## 2012-08-26 ENCOUNTER — Inpatient Hospital Stay (HOSPITAL_COMMUNITY): Payer: MEDICAID | Admitting: *Deleted

## 2012-08-26 ENCOUNTER — Inpatient Hospital Stay (HOSPITAL_COMMUNITY): Payer: No Typology Code available for payment source

## 2012-08-26 ENCOUNTER — Encounter (HOSPITAL_COMMUNITY): Payer: Self-pay | Admitting: Physician Assistant

## 2012-08-26 ENCOUNTER — Encounter (HOSPITAL_COMMUNITY)
Admission: EM | Disposition: A | Discharge status: Home / Self Care | Payer: Self-pay | Source: Home / Self Care | Attending: Internal Medicine

## 2012-08-26 DIAGNOSIS — L97509 Non-pressure chronic ulcer of other part of unspecified foot with unspecified severity: Secondary | ICD-10-CM

## 2012-08-26 DIAGNOSIS — E1169 Type 2 diabetes mellitus with other specified complication: Principal | ICD-10-CM

## 2012-08-26 DIAGNOSIS — Z01810 Encounter for preprocedural cardiovascular examination: Secondary | ICD-10-CM

## 2012-08-26 DIAGNOSIS — L03119 Cellulitis of unspecified part of limb: Secondary | ICD-10-CM

## 2012-08-26 DIAGNOSIS — M9080 Osteopathy in diseases classified elsewhere, unspecified site: Secondary | ICD-10-CM

## 2012-08-26 DIAGNOSIS — I4891 Unspecified atrial fibrillation: Secondary | ICD-10-CM

## 2012-08-26 DIAGNOSIS — L02419 Cutaneous abscess of limb, unspecified: Secondary | ICD-10-CM

## 2012-08-26 DIAGNOSIS — M869 Osteomyelitis, unspecified: Secondary | ICD-10-CM

## 2012-08-26 DIAGNOSIS — I10 Essential (primary) hypertension: Secondary | ICD-10-CM

## 2012-08-26 DIAGNOSIS — I517 Cardiomegaly: Secondary | ICD-10-CM

## 2012-08-26 HISTORY — PX: AMPUTATION: SHX166

## 2012-08-26 LAB — MRSA PCR SCREENING: MRSA by PCR: NEGATIVE

## 2012-08-26 LAB — PROTIME-INR: INR: 1.03 (ref 0.00–1.49)

## 2012-08-26 LAB — CBC
HCT: 31.3 % — ABNORMAL LOW (ref 39.0–52.0)
RDW: 14.7 % (ref 11.5–15.5)
WBC: 8.7 10*3/uL (ref 4.0–10.5)

## 2012-08-26 LAB — GLUCOSE, CAPILLARY
Glucose-Capillary: 306 mg/dL — ABNORMAL HIGH (ref 70–99)
Glucose-Capillary: 319 mg/dL — ABNORMAL HIGH (ref 70–99)
Glucose-Capillary: 107 mg/dL — ABNORMAL HIGH (ref 70–99)

## 2012-08-26 SURGERY — AMPUTATION DIGIT
Anesthesia: General | Site: Foot | Laterality: Left | Wound class: Clean Contaminated

## 2012-08-26 MED ORDER — METOPROLOL TARTRATE 25 MG PO TABS
37.5000 mg | ORAL_TABLET | Freq: Once | ORAL | Status: AC
  Administered 2012-08-26: 37.5 mg via ORAL
  Filled 2012-08-26: qty 1

## 2012-08-26 MED ORDER — ASPIRIN 81 MG PO CHEW
81.0000 mg | CHEWABLE_TABLET | Freq: Every day | ORAL | Status: DC
  Administered 2012-08-26 – 2012-08-29 (×4): 81 mg via ORAL
  Filled 2012-08-26 (×4): qty 1

## 2012-08-26 MED ORDER — METOPROLOL TARTRATE 50 MG PO TABS
50.0000 mg | ORAL_TABLET | Freq: Two times a day (BID) | ORAL | Status: DC
  Administered 2012-08-26 – 2012-08-29 (×6): 50 mg via ORAL
  Filled 2012-08-26 (×7): qty 1

## 2012-08-26 MED ORDER — PROMETHAZINE HCL 25 MG/ML IJ SOLN: 6.2500 mg | INTRAMUSCULAR | Status: DC | PRN

## 2012-08-26 MED ORDER — NON FORMULARY: 40.0000 mg | Freq: Every day | Status: DC

## 2012-08-26 MED ORDER — FENTANYL CITRATE 0.05 MG/ML IJ SOLN: 25.0000 ug | INTRAMUSCULAR | Status: DC | PRN

## 2012-08-26 MED ORDER — ESMOLOL HCL 10 MG/ML IV SOLN
INTRAVENOUS | Status: DC | PRN
  Administered 2012-08-26 (×2): 20 mg via INTRAVENOUS

## 2012-08-26 MED ORDER — LIDOCAINE HCL (CARDIAC) 20 MG/ML IV SOLN
INTRAVENOUS | Status: DC | PRN
  Administered 2012-08-26: 250 mg via INTRAVENOUS

## 2012-08-26 MED ORDER — BUPIVACAINE HCL (PF) 0.5 % IJ SOLN: INTRAMUSCULAR | Status: DC | PRN

## 2012-08-26 MED ORDER — ONDANSETRON HCL 4 MG/2ML IJ SOLN
INTRAMUSCULAR | Status: DC | PRN
  Administered 2012-08-26: 4 mg via INTRAVENOUS

## 2012-08-26 MED ORDER — 0.9 % SODIUM CHLORIDE (POUR BTL) OPTIME
TOPICAL | Status: DC | PRN
  Administered 2012-08-26: 1000 mL

## 2012-08-26 MED ORDER — SODIUM CHLORIDE 0.9 % IJ SOLN
10.0000 mL | INTRAMUSCULAR | Status: DC | PRN
  Administered 2012-08-26 – 2012-08-29 (×2): 10 mL

## 2012-08-26 MED ORDER — METOPROLOL TARTRATE 1 MG/ML IV SOLN
INTRAVENOUS | Status: DC | PRN
  Administered 2012-08-26 (×2): 5 mg via INTRAVENOUS
  Administered 2012-08-26: 10 mg via INTRAVENOUS

## 2012-08-26 MED ORDER — LOVASTATIN 20 MG PO TABS
40.0000 mg | ORAL_TABLET | Freq: Every day | ORAL | Status: DC
  Administered 2012-08-26 – 2012-08-29 (×4): 40 mg via ORAL
  Filled 2012-08-26 (×4): qty 2

## 2012-08-26 MED ORDER — ACETAMINOPHEN 325 MG PO TABS
650.0000 mg | ORAL_TABLET | Freq: Four times a day (QID) | ORAL | Status: DC | PRN
  Administered 2012-08-26 – 2012-08-27 (×2): 650 mg via ORAL
  Filled 2012-08-26: qty 2

## 2012-08-26 MED ORDER — HYDROCODONE-ACETAMINOPHEN 5-325 MG PO TABS
1.0000 | ORAL_TABLET | ORAL | Status: DC | PRN
  Administered 2012-08-26 – 2012-08-28 (×3): 2 via ORAL
  Filled 2012-08-26 (×3): qty 2

## 2012-08-26 MED ORDER — SUCCINYLCHOLINE CHLORIDE 20 MG/ML IJ SOLN
INTRAMUSCULAR | Status: DC | PRN
  Administered 2012-08-26: 160 mg via INTRAVENOUS

## 2012-08-26 MED ORDER — ACETAMINOPHEN 325 MG PO TABS
ORAL_TABLET | ORAL | Status: AC
  Filled 2012-08-26: qty 2

## 2012-08-26 MED ORDER — MIDAZOLAM HCL 5 MG/5ML IJ SOLN
INTRAMUSCULAR | Status: DC | PRN
  Administered 2012-08-26: 2 mg via INTRAVENOUS

## 2012-08-26 MED ORDER — LACTATED RINGERS IV SOLN
INTRAVENOUS | Status: DC | PRN
  Administered 2012-08-26: 13:00:00 via INTRAVENOUS

## 2012-08-26 MED ORDER — MEPERIDINE HCL 50 MG/ML IJ SOLN: 6.2500 mg | INTRAMUSCULAR | Status: DC | PRN

## 2012-08-26 MED ORDER — LACTATED RINGERS IV SOLN: INTRAVENOUS | Status: DC

## 2012-08-26 MED ORDER — INSULIN ASPART 100 UNIT/ML ~~LOC~~ SOLN
10.0000 [IU] | Freq: Once | SUBCUTANEOUS | Status: AC
  Administered 2012-08-26: 10 [IU] via SUBCUTANEOUS

## 2012-08-26 MED ORDER — FENTANYL CITRATE 0.05 MG/ML IJ SOLN
INTRAMUSCULAR | Status: DC | PRN
  Administered 2012-08-26: 100 ug via INTRAVENOUS

## 2012-08-26 SURGICAL SUPPLY — 32 items
BAG ZIPLOCK 12X15 (MISCELLANEOUS) IMPLANT
BANDAGE ELASTIC 4 VELCRO ST LF (GAUZE/BANDAGES/DRESSINGS) ×2 IMPLANT
BLADE SURG 15 STRL LF DISP TIS (BLADE) IMPLANT
BLADE SURG 15 STRL SS (BLADE)
CLOTH BEACON ORANGE TIMEOUT ST (SAFETY) ×2 IMPLANT
CUFF TOURN SGL QUICK 24 (TOURNIQUET CUFF)
CUFF TRNQT CYL 24X4X40X1 (TOURNIQUET CUFF) IMPLANT
DECANTER SPIKE VIAL GLASS SM (MISCELLANEOUS) ×2 IMPLANT
DRAPE LG THREE QUARTER DISP (DRAPES) ×2 IMPLANT
DRAPE SURG 17X11 SM STRL (DRAPES) ×2 IMPLANT
DRAPE U-SHAPE 47X51 STRL (DRAPES) ×2 IMPLANT
DRSG ADAPTIC 3X8 NADH LF (GAUZE/BANDAGES/DRESSINGS) ×2 IMPLANT
DRSG PAD ABDOMINAL 8X10 ST (GAUZE/BANDAGES/DRESSINGS) ×4 IMPLANT
ELECT REM PT RETURN 9FT ADLT (ELECTROSURGICAL) ×2
ELECTRODE REM PT RTRN 9FT ADLT (ELECTROSURGICAL) ×1 IMPLANT
GAUZE XEROFORM 1X8 LF (GAUZE/BANDAGES/DRESSINGS) ×2 IMPLANT
GLOVE BIOGEL PI IND STRL 8 (GLOVE) ×1 IMPLANT
GLOVE BIOGEL PI INDICATOR 8 (GLOVE) ×1
GLOVE ECLIPSE 8.0 STRL XLNG CF (GLOVE) ×2 IMPLANT
GLOVE SURG SS PI 8.0 STRL IVOR (GLOVE) ×4 IMPLANT
GOWN STRL REIN XL XLG (GOWN DISPOSABLE) ×6 IMPLANT
MANIFOLD NEPTUNE II (INSTRUMENTS) ×2 IMPLANT
NEEDLE HYPO 22GX1.5 SAFETY (NEEDLE) ×2 IMPLANT
NS IRRIG 1000ML POUR BTL (IV SOLUTION) IMPLANT
PACK LOWER EXTREMITY WL (CUSTOM PROCEDURE TRAY) ×2 IMPLANT
POSITIONER SURGICAL ARM (MISCELLANEOUS) ×2 IMPLANT
SOL PREP POV-IOD 16OZ 10% (MISCELLANEOUS) ×2 IMPLANT
SOL PREP PROV IODINE SCRUB 4OZ (MISCELLANEOUS) ×2 IMPLANT
SPONGE GAUZE 4X4 12PLY (GAUZE/BANDAGES/DRESSINGS) ×2 IMPLANT
SUT ETHILON 3 0 PS 1 (SUTURE) ×4 IMPLANT
TOWEL OR 17X26 10 PK STRL BLUE (TOWEL DISPOSABLE) ×2 IMPLANT
WATER STERILE IRR 1500ML POUR (IV SOLUTION) IMPLANT

## 2012-08-26 NOTE — Progress Notes (Signed)
Patient has cardizem IV drip running and it's compatible to zosyn IV. Patient has only one IV site and he is a very hard stick. IV team already stuck him 2x tonight and they recommend for PICC line insertion. On call Bretta Bang was made aware. Zosyn IV dose tonight held as per her order.

## 2012-08-26 NOTE — Progress Notes (Signed)
Patient's IV infiltrated.  Cardizem drip stopped.  Cardiology on floor, notified, no new orders.  IV team could not start an IV on the patient.  Pending PICC line insertion per IV team this morning.  Will administer metoprolol as ordered PO.  Pt's HR currently maintaining 100-110 occasionally to 120s.  Will continue to monitor closely.

## 2012-08-26 NOTE — Progress Notes (Signed)
Inpatient Diabetes Program Recommendations  AACE/ADA: New Consensus Statement on Inpatient Glycemic Control (2013)  Target Ranges:  Prepandial:   less than 140 mg/dL      Peak postprandial:   less than 180 mg/dL (1-2 hours)      Critically ill patients:  140 - 180 mg/dL   Reason for Visit: Note A1C=9.1%.  Patient having surgery today for diabetic foot ulcer.  A1C=9.1%.  Will order Outpatient diabetes education per protocol for patient to follow-up in February.

## 2012-08-26 NOTE — Transfer of Care (Signed)
Immediate Anesthesia Transfer of Care Note  Patient: Adrian Farrell  Procedure(s) Performed: Procedure(s) (LRB) with comments: AMPUTATION DIGIT (Left) - Amputation of left  fourth toe  Patient Location: PACU  Anesthesia Type:General  Level of Consciousness: awake, alert  and oriented  Airway & Oxygen Therapy: Patient Spontanous Breathing and Patient connected to face mask oxygen  Post-op Assessment: Report given to PACU RN and Post -op Vital signs reviewed and stable  Post vital signs: Reviewed and stable  Complications: No apparent anesthesia complications

## 2012-08-26 NOTE — H&P (Signed)
  No change in H and P or consult findings.

## 2012-08-26 NOTE — Progress Notes (Signed)
CRITICAL VALUE ALERT  Critical value received:  Blood culture showed gram positive cocci in chains  Date of notification:  08/25/2012  Time of notification:  0045  Critical value read back:yes  Nurse who received alert:  L. Vergel de Lucy Chris RN  MD notified (1st page):  [place]  Time of first page:  0130  MD notified (2nd page):  Time of second page:  Responding MD:  Bretta Bang  Time MD responded:  [phone]

## 2012-08-26 NOTE — Progress Notes (Signed)
  Echocardiogram 2D Echocardiogram has been performed.  Adrian Farrell 08/26/2012, 9:20 AM 

## 2012-08-26 NOTE — Progress Notes (Signed)
Patient's HR was sustaining at 140-160's atrial fib despite a dose of Cardizem 10mg  bolus. Patient is asymptomatic, resting in bed. Doctor on call notified. Cardizem IV drip started, IV 10mg  bolus given as ordered. HR now is down to 120's. Will continue to monitor.

## 2012-08-26 NOTE — Consult Note (Signed)
CARDIOLOGY CONSULT NOTE  Patient ID: Adrian Farrell, MRN: [id number], DOB/AGE: February 14, 1966 47 y.o. Admit date: 08/24/2012   Date of Consult: 08/26/2012 Primary Physician: No primary provider on file. Primary Cardiologist: New to [facility]  Chief Complaint: lip swelling, foot ulcer Reason for Consult: preop eval, newly recognized atrial fib  HPI: Adrian Farrell is a 47 y/o M with no prior cardiac history but DM x 8 yrs, HTN, OSA compliant with CPAP presented to the ER with 2 complaints - lip swelling/angioedema and foot ulcer. 2 nights ago he rapidly developed lip swelling occurring in the context of many years of lisinopril without associated tongue swelling or difficulty breathing. He received Solu-Medrol/benadryl/pepcid in the ED. The second issue the patient was reluctant to discuss was skin breakdown between the toes of his L foot and toe gangrene. He stated it started 2 weeks ago as a "water blister" between his 4th and 5th toes. This progressed to pus, redness, and mild discomfort. In the ER, X-ray revealed osteomyelitis of the 4th toe. He was started on vanc/zosyn & is being considered for possible amputation.   We are asked to evaluate him preoperatively as well as to evaluate newly recognized atrial fibrillation. Pre-op tracing yesterday demonstrated atrial fib with RVR 145bpm. Admit HR on 1/22 was between 71-115 so unclear duration. The patient is completely unaware of this heart rhythm. In late 2013, he made the decision to start getting healthier and has been exercising daily up until 2 weeks ago when he got the flu & the foot blister. He was able to do the elliptical machine without any chest pain, SOB, or dizziness. He also made the conscious effort to eat healthier and lost 34 lbs since Christmas (replaced fried chicken with fruit). He was given 10mg  dilt bolus last night. His IV infiltrated this AM so he is not currently receiving his dilt gtt - HR in the 100-120 range. He denies any formal dx  of CHF, but does report chronic LEE which he states is better today than it usually is. No orthopnea, PND, syncope, stroke/ministroke, or bleeding problems.  Labs significant for initial WBC 14k (now normal), Hgb 10.2 (9.9 today), Plt 645 (now normal), A1C 9.1, AST 43/ALT 95, blood cx pending. CXR low lung volumes without acute dz. ABI WNL. 2D Echo mild LVH, EF 55-60%, no RWMA, mildly dilated LA/RA.  Past Medical History  Diagnosis Date  . Hypertension   . Diabetes mellitus     Diagnosed ~2006. has complication as of 1/23, diabetic foot ulcer  . OSA on CPAP     Compliant, diagnosed 2003.      Most Recent Cardiac Studies: 2D Echo Study Conclusions 08/26/12 - Left ventricle: The cavity size was normal. Wall thickness was increased in a pattern of mild LVH. Systolic function was normal. The estimated ejection fraction was in the range of 55% to 60%. Wall motion was normal; there were no regional wall motion abnormalities. - Left atrium: The atrium was mildly dilated. - Right atrium: The atrium was mildly dilated.     Surgical History: History reviewed. No pertinent past surgical history.   Home Meds: Prior to Admission medications   Medication Sig Start Date End Date Taking? Authorizing Provider  furosemide (LASIX) 40 MG tablet Take 40 mg by mouth daily.   Yes Historical Provider, MD  insulin aspart protamine-insulin aspart (NOVOLOG 70/30) (70-30) 100 UNIT/ML injection Inject 40 Units into the skin 2 (two) times daily with a meal.   Yes Historical Provider, MD  metFORMIN (GLUCOPHAGE) 500 MG tablet Take 500 mg by mouth 2 (two) times daily with a meal.   Yes Historical Provider, MD  Potassium Gluconate 550 MG TABS Take 1 tablet by mouth daily.   Yes Historical Provider, MD  sulfamethoxazole-trimethoprim (BACTRIM DS,SEPTRA DS) 800-160 MG per tablet Take 1 tablet by mouth 2 (two) times daily.   Yes Historical Provider, MD    Inpatient Medications:    . furosemide  40 mg Oral Daily  .  heparin  5,000 Units Subcutaneous Q8H  . insulin aspart  0-15 Units Subcutaneous TID WC  . insulin aspart protamine-insulin aspart  40 Units Subcutaneous BID WC  . metoprolol tartrate  12.5 mg Oral BID  . piperacillin-tazobactam (ZOSYN)  IV  3.375 g Intravenous Q8H  . potassium gluconate  595 mg Oral Daily  . vancomycin  1,500 mg Intravenous Q12H    Allergies:  Allergies  Allergen Reactions  . Lisinopril Swelling    Angioedema    History   Social History  . Marital Status: Single    Spouse Name: N/A    Number of Children: N/A  . Years of Education: N/A   Occupational History  . Not on file.   Social History Main Topics  . Smoking status: Never Smoker   . Smokeless tobacco: Never Used  . Alcohol Use: Yes     Comment: Rare, once every 3-4 months  . Drug Use: No  . Sexually Active: No   Other Topics Concern  . Not on file   Social History Narrative  . No narrative on file     Family History  Problem Relation Age of Onset  . Kidney failure Mother     Related to cocaine  . CAD Neg Hx      Review of Systems: General: negative for chills, fever, night sweats. +Intentional weight loss Cardiovascular: see above Dermatological: negative for rash Respiratory: occ cough since having URI 2 weeks ago Urologic: negative for hematuria Abdominal: negative for nausea, vomiting, diarrhea, bright red blood per rectum, melena, or hematemesis Neurologic: negative for visual changes, syncope, or dizziness All other systems reviewed and are otherwise negative except as noted above.  Labs:  Lab Results  Component Value Date   WBC 8.7 09/01/12   HGB 9.9* 09-01-12   HCT 31.3* Sep 01, 2012   MCV 77.9* September 01, 2012   PLT 370 09-01-2012    Lab 08/25/12 0509 08/24/12 2252  NA 129* --  K 4.9 --  CL 96 --  CO2 21 --  BUN 15 --  CREATININE 0.93 --  CALCIUM 8.9 --  PROT -- 7.2  BILITOT -- 0.2*  ALKPHOS -- 110  ALT -- 95*  AST -- 43*  GLUCOSE 340* --    Radiology/Studies:    Dg Chest Port 1 View 09/01/2012  *RADIOLOGY REPORT*  Clinical Data: Preop for atrial fibrillation.  Hypertension.  PORTABLE CHEST - 1 VIEW  Comparison: 03/02/2010  Findings: Midline trachea.  Normal heart size for level of inspiration.  Mildly degraded exam due to AP portable technique and patient body habitus.  No definite pleural fluid. No pneumothorax.  Low lung volumes with resultant pulmonary interstitial prominence.  IMPRESSION: Low lung volumes without acute disease.   Original Report Authenticated By: Jeronimo Greaves, M.D.    Dg Foot Complete Left 08/24/2012  *RADIOLOGY REPORT*  Clinical Data: Ulcerations at the top of the left foot.  LEFT FOOT - COMPLETE 3+ VIEW  Comparison: None.  Findings: Three views of the left foot were obtained.  There is lucency and bone loss in the fourth toe proximal phalanx.  There is also marked lucency involving the fourth toe middle phalanx.  There appears to be a dislocation or subluxation involving the fourth toe PIP joint.  The fourth toe is poorly visualized on the lateral view.  There is a prominent spur involving the plantar aspect of the calcaneus.  There appears to be soft tissue edema within the foot.  IMPRESSION: There is lucency and bone loss involving the fourth toe proximal phalanx and middle phalanx.  There is probably a dislocation at the fourth toe PIP joint.  These findings are concerning for osteomyelitis.   Original Report Authenticated By: Richarda Overlie, M.D.     EKG: 08/25/12 - atrial fib RVR 145bpm, nonspecific ST-T changes, no prior to compare  Physical Exam:Blood pressure 118/69, pulse 105, temperature 98.2 F (36.8 C), temperature source Oral, resp. rate 18, height 6\' 1"  (1.854 m), weight 383 lb (173.728 kg), SpO2 99.00%. General: Well developed, well nourished obese AAM in no acute distress. Head: Normocephalic, atraumatic, sclera non-icteric, no xanthomas, nares are without discharge. No significant lip swelling. Neck: Negative for carotid bruits.  JVD not elevated. Lungs: Coarse at bases. Otherwise no wheezes, rales, or rhonchi. Breathing is unlabored. Heart: Irregular, tachycardic with S1 S2. No murmurs, rubs, or gallops appreciated. Abdomen: Soft, non-tender, non-distended with normoactive bowel sounds. No hepatomegaly. No rebound/guarding. No obvious abdominal masses. Msk:  Strength and tone appear normal for age. Extremities: No clubbing or cyanosis. Hyperpigmentation changes bilat LE with thickening of skin. 1-2+ LE edema; with obesity difficult to assess baseline leg size. L ankle/foot seems warmer than R and is currently dressed. Distal pulses are in tact equally bilaterally.  Neuro: Alert and oriented X 3. No facial asymmetry. No focal deficit. Moves all extremities spontaneously. Psych:  Responds to questions appropriately with a normal affect.   Assessment and Plan:   1. Angioedema - resolved, due to lisinopril. Avoid ACEI in future. 2. Osteomyelitis & diabetic foot ulcer - per IM. ABI normal. Will discuss risk stratification for preop eval with MD. Good functional status prior to 2 weeks ago when foot problems started. Check lipids. 3. Newly recognized atrial fibrillation - unclear duration. HR 74 on admit, but variable yesterday from 115->70s->140s->89. Has been on tele since last night showing only AF-RVR. The patient is completely unaware of his heart rhythm so he could have had PAF outside the hospital. CHADS2 preliminarily 2 for HTN/DM. However, he does have anemia that will need workup (denies bleeding). Check TSH. Will discuss tx with MD. 4. Lower extremity edema, chronic - question diastolic CHF versus venous/obesity related. Albumin is low but may be acute phase reactant. Will discuss tx with MD. 2D echo with normal EF, mild LVH.  5. Diabetes mellitus, uncontrolled - per internal med. The patient said he thought the A1C of 9.1 was probably because his regimen here in the hospital is different, and said he thought it was  probably closer to 6 at home. We discussed the mechanism of A1C as an average measurement over 3 months. Encouraged continued compliance with meds & diet. Check lipids.  6. HTN - currently controlled; monitor with med adjustments. 7. OSA -  compliant with CPAP, states "it's my best friend." 8. Morbid obesity BMI 50.6 - has intentionally lost 34 lbs since Christmas with diet and exercise. Congratulated his success & encouraged continued adherence. 9. Heme: leukocytosis and thrombocytosis were likely reactive from #1/2. Anemia, however, persists for unclear cause. Pt  denies bleeding. Hemoccult stools. Will need further workup. 10. Mild transaminitis - repeat CMET tomorrow, check baseline PT/INR.  Signed, Ronie Spies PA-C 08/26/2012, 9:41 AM   Patient seen and examined with Ronie Spies, PA-C. We discussed all aspects of the encounter. I agree with the assessment and plan as stated above.   47 y/o male as above. Admitted with angioedema, LE osteo and new AF. He is morbidly obese but recently has been very active and lost 34 pounds. Denies any evidence of angina with exercise.   Although he has multiple cardiac RFs including DM2 and PAD he is very active without anginal symptoms and has normal echo. Thus I think he is low risk for peri-op CV complications and can proceed to foot surgery without further cardiac testing. Can consider further risk stratification as outpatient. Would benefit from ASA and statin.  With regard to AF, this appears to be new onset. Would stop IV diltiazem and treat with lopressor 50 bid. Can titrate as needed. Will need anticoagulation. Unable to afford NOAC so would start coumadin post-operatively.  Can consider DC-CV as outpatient if he doesn't convert before then (I expect he will.)  Can diurese as needed.   Christene Pounds,MD 11:07 AM

## 2012-08-26 NOTE — Anesthesia Postprocedure Evaluation (Signed)
  Anesthesia Post-op Note  Patient: Adrian Farrell  Procedure(s) Performed: Procedure(s) (LRB): AMPUTATION DIGIT (Left)  Patient Location: PACU  Anesthesia Type: General  Level of Consciousness: awake and alert   Airway and Oxygen Therapy: Patient Spontanous Breathing  Post-op Pain: mild  Post-op Assessment: Post-op Vital signs reviewed, Patient's Cardiovascular Status Stable, Respiratory Function Stable, Patent Airway and No signs of Nausea or vomiting  Last Vitals:  Filed Vitals:   08/26/12 1613  BP: 136/85  Pulse: 86  Temp: 36.5 C  Resp: 18    Post-op Vital Signs: stable   Complications: No apparent anesthesia complications

## 2012-08-26 NOTE — Progress Notes (Signed)
[facility] PROGRESS NOTE  Adrian Farrell NUU:[id number] DOB: Dec 17, 1965 DOA: 08/24/2012 PCP: No primary provider on file.  Assessment/Plan: 1. Atrial Fibrillation - Reportedly new onset at this juncture - Echocardiogram ordered and cardiology consult placed.  Would like to thank Cardiology for their input in this case. - Overnight was placed on cardizem gtt  2. Diabetic foot ulcer with osteomyelitis: - Contacted Orthopaedic surgery and they have recommended surgical intervention.  I would like to thank Ortho for their input and help in this case. - Will f/u with ortho's recommendations  3. Angioedema - Resolved with discontinuation of lisinopril.  - At this juncture most likely 2ary to lisinopril ingestion as outpatient.  4. HTN - Patient is currently on metoprolol at this juncture should help with patient's blood pressure as well as his a fib. - monitor blood pressures and adjust medication pending blood pressure readings.   Code Status: full Family Communication: no family at bedside Disposition Plan: Pending further recommendations from specialist.   Consultants:  Ortho  cardiology  Procedures:  Left foot 4th toe amputation  Antibiotics:  Vanc and Zosyn  HPI/Subjective: No new complaints today.  No acute issues reported overnight.  Objective: Filed Vitals:   08/26/12 0503 08/26/12 1023 08/26/12 1243 08/26/12 1530  BP: 118/69 125/74 132/78   Pulse: 105 102 86   Temp: 98.2 F (36.8 C)  98.2 F (36.8 C) 98.9 F (37.2 C)  TempSrc: Oral  Oral   Resp: 18  18   Height:      Weight:      SpO2: 99%  100%     Intake/Output Summary (Last 24 hours) at 08/26/12 1543 Last data filed at 08/26/12 1532  Gross per 24 hour  Intake   1100 ml  Output      0 ml  Net   1100 ml   Filed Weights   08/25/12 0647 08/25/12 1114  Weight: 171.46 kg (378 lb) 173.728 kg (383 lb)    Exam:   General:  Pt in NAD, Alert and Awake  Cardiovascular: Irregular  rate controlled, no murmurs  Respiratory: CTA BL, no wheezes  Abdomen: soft, NT, ND  Extremities: left foot gauze in place no active bleeding.  Data Reviewed: Basic Metabolic Panel:  Lab 08/25/12 3474 08/24/12 2252  NA 129* 132*  K 4.9 3.7  CL 96 97  CO2 21 24  GLUCOSE 340* 165*  BUN 15 13  CREATININE 0.93 1.18  CALCIUM 8.9 8.8  MG -- --  PHOS -- --   Liver Function Tests:  Lab 08/24/12 2252  AST 43*  ALT 95*  ALKPHOS 110  BILITOT 0.2*  PROT 7.2  ALBUMIN 2.3*   No results found for this basename: LIPASE:5,AMYLASE:5 in the last 168 hours No results found for this basename: AMMONIA:5 in the last 168 hours CBC:  Lab 08/26/12 0755 08/25/12 0509 08/24/12 2252  WBC 8.7 15.3* 14.1*  NEUTROABS -- -- 10.3*  HGB 9.9* 10.1* 10.2*  HCT 31.3* 31.3* 30.9*  MCV 77.9* 78.1 78.4  PLT 370 652* 645*   Cardiac Enzymes: No results found for this basename: CKTOTAL:5,CKMB:5,CKMBINDEX:5,TROPONINI:5 in the last 168 hours BNP (last 3 results) No results found for this basename: PROBNP:3 in the last 8760 hours CBG:  Lab 08/26/12 1531 08/26/12 0733 08/26/12 0150 08/25/12 2029 08/25/12 1652  GLUCAP 107* 319* 306* 151* 228*    Recent Results (from the past 240 hour(s))  CULTURE, BLOOD (ROUTINE X 2)     Status: Normal (Preliminary result)  Collection Time   08/25/12 12:35 AM      Component Value Range Status Comment   Specimen Description BLOOD LEFT ANTECUBITAL   Final    Special Requests BOTTLES DRAWN AEROBIC AND ANAEROBIC [facility]   Final    Culture  Setup Time 08/25/2012 04:58   Final    Culture     Final    Value:        BLOOD CULTURE RECEIVED NO GROWTH TO DATE CULTURE WILL BE HELD FOR 5 DAYS BEFORE ISSUING A FINAL NEGATIVE REPORT   Report Status PENDING   Incomplete   CULTURE, BLOOD (ROUTINE X 2)     Status: Normal (Preliminary result)   Collection Time   08/25/12 12:42 AM      Component Value Range Status Comment   Specimen Description BLOOD LEFT HAND   Final    Special  Requests BOTTLES DRAWN AEROBIC AND ANAEROBIC [facility]   Final    Culture  Setup Time 08/25/2012 04:58   Final    Culture     Final    Value:        BLOOD CULTURE RECEIVED NO GROWTH TO DATE CULTURE WILL BE HELD FOR 5 DAYS BEFORE ISSUING A FINAL NEGATIVE REPORT   Report Status PENDING   Incomplete   MRSA PCR SCREENING     Status: Normal   Collection Time   08/26/12  5:10 AM      Component Value Range Status Comment   MRSA by PCR NEGATIVE  NEGATIVE Final      Studies: Dg Chest Port 1 View  08/26/2012  *RADIOLOGY REPORT*  Clinical Data: Line placement.  PORTABLE CHEST - 1 VIEW  Comparison: 08/26/2012  Findings: Right PICC line is in place with the tip in the SVC. Heart is borderline in size.  No confluent airspace opacities or effusions.  No acute bony abnormality.  IMPRESSION: Right PICC line tip in the SVC.  Otherwise no change.   Original Report Authenticated By: Charlett Nose, M.D.    Dg Chest Port 1 View  08/26/2012  *RADIOLOGY REPORT*  Clinical Data: Preop for atrial fibrillation.  Hypertension.  PORTABLE CHEST - 1 VIEW  Comparison: 03/02/2010  Findings: Midline trachea.  Normal heart size for level of inspiration.  Mildly degraded exam due to AP portable technique and patient body habitus.  No definite pleural fluid. No pneumothorax.  Low lung volumes with resultant pulmonary interstitial prominence.  IMPRESSION: Low lung volumes without acute disease.   Original Report Authenticated By: Jeronimo Greaves, M.D.    Dg Foot Complete Left  08/24/2012  *RADIOLOGY REPORT*  Clinical Data: Ulcerations at the top of the left foot.  LEFT FOOT - COMPLETE 3+ VIEW  Comparison: None.  Findings: Three views of the left foot were obtained.  There is lucency and bone loss in the fourth toe proximal phalanx.  There is also marked lucency involving the fourth toe middle phalanx.  There appears to be a dislocation or subluxation involving the fourth toe PIP joint.  The fourth toe is poorly visualized on the lateral view.   There is a prominent spur involving the plantar aspect of the calcaneus.  There appears to be soft tissue edema within the foot.  IMPRESSION: There is lucency and bone loss involving the fourth toe proximal phalanx and middle phalanx.  There is probably a dislocation at the fourth toe PIP joint.  These findings are concerning for osteomyelitis.   Original Report Authenticated By: Richarda Overlie, M.D.  Scheduled Meds:   . [facility] HOLD] aspirin  81 mg Oral Daily  . [facility] HOLD] furosemide  40 mg Oral Daily  . [MAR HOLD] heparin  5,000 Units Subcutaneous Q8H  . [MAR HOLD] insulin aspart  0-15 Units Subcutaneous TID WC  . [MAR HOLD] insulin aspart protamine-insulin aspart  40 Units Subcutaneous BID WC  . [MAR HOLD] lovastatin  40 mg Oral Daily  . [facility] HOLD] metoprolol tartrate  50 mg Oral BID  . [MAR HOLD] piperacillin-tazobactam (ZOSYN)  IV  3.375 g Intravenous Q8H  . [facility] HOLD] vancomycin  1,500 mg Intravenous Q12H   Continuous Infusions:   . lactated ringers      Principal Problem:  *Diabetic foot ulcer with osteomyelitis Active Problems:  Angioedema  Atrial fibrillation  Pre-operative cardiovascular examination    Time spent: >    Penny Pia  [facility] Pager [phone] If 8PM-8AM, please contact night-coverage at [email or web address], password [facility] 08/26/2012, 3:43 PM  LOS: 2 days

## 2012-08-26 NOTE — Anesthesia Preprocedure Evaluation (Addendum)
Anesthesia Evaluation  Patient identified by MRN, date of birth, ID band Patient awake    Reviewed: Allergy & Precautions, H&P , NPO status , Patient's Chart, lab work & pertinent test results  Airway Mallampati: II TM Distance: >3 FB Neck ROM: Full    Dental No notable dental hx.    Pulmonary neg pulmonary ROS, sleep apnea and Continuous Positive Airway Pressure Ventilation ,  breath sounds clear to auscultation  Pulmonary exam normal       Cardiovascular hypertension, Pt. on medications negative cardio ROS  + dysrhythmias Atrial Fibrillation Rhythm:Regular Rate:Normal     Neuro/Psych negative neurological ROS  negative psych ROS   GI/Hepatic negative GI ROS, Neg liver ROS,   Endo/Other  negative endocrine ROSdiabetes, Oral Hypoglycemic Agents and Insulin Dependent  Renal/GU negative Renal ROS  negative genitourinary   Musculoskeletal negative musculoskeletal ROS (+)   Abdominal   Peds negative pediatric ROS (+)  Hematology negative hematology ROS (+)   Anesthesia Other Findings   Reproductive/Obstetrics negative OB ROS                          Anesthesia Physical Anesthesia Plan  ASA: III  Anesthesia Plan: General   Post-op Pain Management:    Induction: Intravenous  Airway Management Planned: Oral ETT  Additional Equipment:   Intra-op Plan:   Post-operative Plan: Extubation in OR  Informed Consent: I have reviewed the patients History and Physical, chart, labs and discussed the procedure including the risks, benefits and alternatives for the proposed anesthesia with the patient or authorized representative who has indicated his/her understanding and acceptance.   Dental advisory given  Plan Discussed with: CRNA  Anesthesia Plan Comments:         Anesthesia Quick Evaluation

## 2012-08-26 NOTE — Progress Notes (Signed)
PACU NOTE:  PICC line fluids placed on pump at 50 mls/hr.  Vanc placed on pump at 250 mls/hr per pharmacy label

## 2012-08-26 NOTE — Progress Notes (Signed)
Peripherally Inserted Central Catheter/Midline Placement  The IV Nurse has discussed with the patient and/or persons authorized to consent for the patient, the purpose of this procedure and the potential benefits and risks involved with this procedure.  The benefits include less needle sticks, lab draws from the catheter and patient may be discharged home with the catheter.  Risks include, but not limited to, infection, bleeding, blood clot (thrombus formation), and puncture of an artery; nerve damage and irregular heat beat.  Alternatives to this procedure were also discussed.  PICC/Midline Placement Documentation        Adrian Farrell 08/26/2012, 12:20 PM

## 2012-08-26 NOTE — Brief Op Note (Signed)
08/24/2012 - 08/26/2012  2:50 PM  PATIENT:  Adrian Farrell  47 y.o. male  PRE-OPERATIVE DIAGNOSIS:  Gangrene of Left third,fourth, and fifth toe  POST-OPERATIVE DIAGNOSIS:  Gangrene of Left third,fourth, and fifth toe  PROCEDURE:  Procedure(s) (LRB) with comments: AMPUTATION DIGIT (Left) - Amputation of left  fourth   SURGEON:  Surgeon(s) and Role:    * Sherri Rad, MD - Primary  PHYSICIAN ASSISTANT: Rexene Edison, PAC   ASSISTANTS: Rexene Edison, [facility]    ANESTHESIA:   general  EBL:     BLOOD ADMINISTERED:none  DRAINS: none   LOCAL MEDICATIONS USED:  NONE  SPECIMEN:  Source of Specimen:  toe  DISPOSITION OF SPECIMEN:  N/A  COUNTS:  YES  TOURNIQUET:  * No tourniquets in log *  DICTATION: .Other Dictation: Dictation Number [id number]  PLAN OF CARE: Admit to inpatient   PATIENT DISPOSITION:  PACU - hemodynamically stable.   Delay start of Pharmacological VTE agent (>24hrs) due to surgical blood loss or risk of bleeding: no

## 2012-08-26 NOTE — Progress Notes (Signed)
Talked to patient about follow up medical care. Patient was going to [facility] and is planning to returning to that practice after discharge. Patient has Insurance through the [employer] Act [employer]) and it will go into effect 09/03/2012 with [employer]. 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 B Sanjith Siwek RN,BSN,MHA.

## 2012-08-27 ENCOUNTER — Encounter (HOSPITAL_COMMUNITY): Payer: Self-pay | Admitting: *Deleted

## 2012-08-27 DIAGNOSIS — I10 Essential (primary) hypertension: Secondary | ICD-10-CM

## 2012-08-27 LAB — CBC
Hemoglobin: 9.4 g/dL — ABNORMAL LOW (ref 13.0–17.0)
Platelets: 520 10*3/uL — ABNORMAL HIGH (ref 150–400)
RBC: 3.71 MIL/uL — ABNORMAL LOW (ref 4.22–5.81)
WBC: 9.4 10*3/uL (ref 4.0–10.5)

## 2012-08-27 LAB — TSH: TSH: 1.364 u[IU]/mL (ref 0.350–4.500)

## 2012-08-27 LAB — LIPID PANEL
LDL Cholesterol: 95 mg/dL (ref 0–99)
Total CHOL/HDL Ratio: 7.8 RATIO
VLDL: 35 mg/dL (ref 0–40)

## 2012-08-27 LAB — COMPREHENSIVE METABOLIC PANEL
ALT: 98 U/L — ABNORMAL HIGH (ref 0–53)
AST: 37 U/L (ref 0–37)
Alkaline Phosphatase: 93 U/L (ref 39–117)
CO2: 27 mEq/L (ref 19–32)
Calcium: 9 mg/dL (ref 8.4–10.5)
Chloride: 99 mEq/L (ref 96–112)
GFR calc non Af Amer: >90 mL/min (ref >90)
Glucose, Bld: 253 mg/dL — ABNORMAL HIGH (ref 70–99)
Potassium: 3.9 mEq/L (ref 3.5–5.1)
Sodium: 133 mEq/L — ABNORMAL LOW (ref 135–145)
Total Bilirubin: 0.2 mg/dL — ABNORMAL LOW (ref 0.3–1.2)

## 2012-08-27 LAB — GLUCOSE, CAPILLARY: Glucose-Capillary: 262 mg/dL — ABNORMAL HIGH (ref 70–99)

## 2012-08-27 MED ORDER — INSULIN ASPART 100 UNIT/ML ~~LOC~~ SOLN
4.0000 [IU] | Freq: Once | SUBCUTANEOUS | Status: AC
  Administered 2012-08-27: 4 [IU] via SUBCUTANEOUS

## 2012-08-27 MED ORDER — PANTOPRAZOLE SODIUM 40 MG PO TBEC
40.0000 mg | DELAYED_RELEASE_TABLET | Freq: Every day | ORAL | Status: DC
  Administered 2012-08-27 – 2012-08-29 (×3): 40 mg via ORAL
  Filled 2012-08-27 (×3): qty 1

## 2012-08-27 NOTE — Progress Notes (Addendum)
Subjective:  Sitting up in chair, feeling well. No CP, no SOB. He says he converted from AFIB. Angioedema resolved.   Objective:  Vital Signs in the last 24 hours: Temp:  [97.7 F (36.5 C)-98.9 F (37.2 C)] 98.6 F (37 C) (01/25 0446) Pulse Rate:  [86-102] 90  (01/25 0446) Resp:  [17-22] 18  (01/25 0446) BP: (125-143)/(70-85) 143/72 mmHg (01/25 0446) SpO2:  [96 %-100 %] 100 % (01/25 0446)  Intake/Output from previous day: 01/24 0701 - 01/25 0700 In: 1450 [I.V.:750; IV Piggyback:700] Out: -    Physical Exam: General: Well developed, well nourished, in no acute distress. Head:  Normocephalic and atraumatic. Lungs: Clear to auscultation and percussion. Heart: Normal S1 and S2.  No murmur, rubs or gallops.  Abdomen: soft, non-tender, positive bowel sounds. Extremities: No clubbing or cyanosis. No edema. Neurologic: Alert and oriented x 3.    Lab Results:  Basename 08/27/12 0525 08/26/12 0755  WBC 9.4 8.7  HGB 9.4* 9.9*  PLT 520* 370    Basename 08/27/12 0525 08/25/12 0509  NA 133* 129*  K 3.9 4.9  CL 99 96  CO2 27 21  GLUCOSE 253* 340*  BUN 8 15  CREATININE 0.72 0.93   No results found for this basename: TROPONINI:2,CK,MB:2 in the last 72 hours Hepatic Function Panel  Basename 08/27/12 0525  PROT 6.8  ALBUMIN 2.3*  AST 37  ALT 98*  ALKPHOS 93  BILITOT 0.2*  BILIDIR --  IBILI --   No results found for this basename: CHOL in the last 72 hours No results found for this basename: PROTIME in the last 72 hours  Imaging: Dg Chest Port 1 View  08/26/2012  *RADIOLOGY REPORT*  Clinical Data: Line placement.  PORTABLE CHEST - 1 VIEW  Comparison: 08/26/2012  Findings: Right PICC line is in place with the tip in the SVC. Heart is borderline in size.  No confluent airspace opacities or effusions.  No acute bony abnormality.  IMPRESSION: Right PICC line tip in the SVC.  Otherwise no change.   Original Report Authenticated By: Charlett Nose, M.D.    Dg Chest Port 1  View  08/26/2012  *RADIOLOGY REPORT*  Clinical Data: Preop for atrial fibrillation.  Hypertension.  PORTABLE CHEST - 1 VIEW  Comparison: 03/02/2010  Findings: Midline trachea.  Normal heart size for level of inspiration.  Mildly degraded exam due to AP portable technique and patient body habitus.  No definite pleural fluid. No pneumothorax.  Low lung volumes with resultant pulmonary interstitial prominence.  IMPRESSION: Low lung volumes without acute disease.   Original Report Authenticated By: Jeronimo Greaves, M.D.    Personally viewed.   Telemetry: Converted to NSR yesterday from AFIB Personally viewed.    Cardiac Studies:  Normal EF, mild LVH  Assessment/Plan:  Principal Problem:  *Diabetic foot ulcer with osteomyelitis Active Problems:  Angioedema  Atrial fibrillation  Pre-operative cardiovascular examination  HTN (hypertension)  1. AFIB - converted. Excellent. Likely exacerbated by infection. Continue metoprolol.    2. Continue with ASA. With his DM and HTN (CHADS = 2) would require anticoagulation if atrial fibrillation had continued. Continue on telemetry while here in the hospital and if afib returns, I would suggest warfarin.   3. Avoid ACE-I or ARB - angioedema  4. Did well through surgery. Note reviewed.   Will follow.   Laasya Peyton 08/27/2012, 8:12 AM

## 2012-08-27 NOTE — Progress Notes (Signed)
Went by to check on patient and his nocturnal CPAP. Found already on machine from home with full face mask. Patient is aware that he may ask for assistance at any time he feels the need.

## 2012-08-27 NOTE — Progress Notes (Signed)
[facility] PROGRESS NOTE  PARRY PO AVW:[id number] DOB: 12-24-1965 DOA: 08/24/2012 PCP: No primary provider on file.  Assessment/Plan: 1. Atrial Fibrillation - Reportedly new onset at this juncture and has resolved. - Echocardiogram ordered and cardiology consult placed.  Would like to thank Cardiology for their input in this case. - If patient does not have any more afib he will be continued on aspirin but afib recurs he will need coumadin therapy.  Discussed with patient.  2. Diabetic foot ulcer with osteomyelitis: - Contacted Orthopaedic surgery and they have recommended surgical intervention.  I would like to thank Ortho for their input and help in this case. - Will f/u with ortho's recommendations. Pt is s/p day 1 Left 4 toe amputation  3. Angioedema - Resolved with discontinuation of lisinopril.  - At this juncture most likely 2ary to lisinopril ingestion as outpatient. - Have discussed avoiding lisinopril with patient of which he verbalizes understanding and agreement.  4. HTN - Patient is currently on metoprolol at this juncture should help with patient's blood pressure as well as his a fib. - monitor blood pressures and adjust medication pending blood pressure readings.   Code Status: full Family Communication: no family at bedside Disposition Plan: Pending further recommendations from specialist.   Consultants:  Ortho  cardiology  Procedures:  Left foot 4th toe amputation  Antibiotics:  Vanc and Zosyn  HPI/Subjective: No new complaints today denies any chest pain or heart palpitations. No acute issues reported overnight.  Objective: Filed Vitals:   08/26/12 1613 08/26/12 2119 08/27/12 0446 08/27/12 0900  BP: 136/85 134/70 143/72 120/74  Pulse: 86 90 90 96  Temp: 97.7 F (36.5 C) 97.8 F (36.6 C) 98.6 F (37 C) 97.6 F (36.4 C)  TempSrc:  Oral Oral Oral  Resp: 18 18 18 19   Height:      Weight:      SpO2: 100% 100% 100% 100%     Intake/Output Summary (Last 24 hours) at 08/27/12 1313 Last data filed at 08/27/12 0800  Gross per 24 hour  Intake   1930 ml  Output      0 ml  Net   1930 ml   Filed Weights   08/25/12 0647 08/25/12 1114  Weight: 171.46 kg (378 lb) 173.728 kg (383 lb)    Exam:   General:  Pt in NAD, Alert and Awake  Cardiovascular: RRR, no murmurs  Respiratory: CTA BL, no wheezes  Abdomen: soft, NT, ND  Extremities: left foot gauze in place no active bleeding.  Data Reviewed: Basic Metabolic Panel:  Lab 08/27/12 8295 08/25/12 0509 08/24/12 2252  NA 133* 129* 132*  K 3.9 4.9 3.7  CL 99 96 97  CO2 27 21 24   GLUCOSE 253* 340* 165*  BUN 8 15 13   CREATININE 0.72 0.93 1.18  CALCIUM 9.0 8.9 8.8  MG -- -- --  PHOS -- -- --   Liver Function Tests:  Lab 08/27/12 0525 08/24/12 2252  AST 37 43*  ALT 98* 95*  ALKPHOS 93 110  BILITOT 0.2* 0.2*  PROT 6.8 7.2  ALBUMIN 2.3* 2.3*   No results found for this basename: LIPASE:5,AMYLASE:5 in the last 168 hours No results found for this basename: AMMONIA:5 in the last 168 hours CBC:  Lab 08/27/12 0525 08/26/12 0755 08/25/12 0509 08/24/12 2252  WBC 9.4 8.7 15.3* 14.1*  NEUTROABS -- -- -- 10.3*  HGB 9.4* 9.9* 10.1* 10.2*  HCT 29.3* 31.3* 31.3* 30.9*  MCV 79.0 77.9* 78.1 78.4  PLT 520* 370 652* 645*   Cardiac Enzymes: No results found for this basename: CKTOTAL:5,CKMB:5,CKMBINDEX:5,TROPONINI:5 in the last 168 hours BNP (last 3 results) No results found for this basename: PROBNP:3 in the last 8760 hours CBG:  Lab 08/27/12 1151 08/27/12 0720 08/26/12 2112 08/26/12 1531 08/26/12 1223  GLUCAP 112* 262* 264* 107* 163*    Recent Results (from the past 240 hour(s))  CULTURE, BLOOD (ROUTINE X 2)     Status: Normal (Preliminary result)   Collection Time   08/25/12 12:35 AM      Component Value Range Status Comment   Specimen Description BLOOD LEFT ANTECUBITAL   Final    Special Requests BOTTLES DRAWN AEROBIC AND ANAEROBIC 6CC   Final     Culture  Setup Time 08/25/2012 04:58   Final    Culture     Final    Value:        BLOOD CULTURE RECEIVED NO GROWTH TO DATE CULTURE WILL BE HELD FOR 5 DAYS BEFORE ISSUING A FINAL NEGATIVE REPORT   Report Status PENDING   Incomplete   CULTURE, BLOOD (ROUTINE X 2)     Status: Normal (Preliminary result)   Collection Time   08/25/12 12:42 AM      Component Value Range Status Comment   Specimen Description BLOOD LEFT HAND   Final    Special Requests BOTTLES DRAWN AEROBIC AND ANAEROBIC [facility]   Final    Culture  Setup Time 08/25/2012 04:58   Final    Culture     Final    Value:        BLOOD CULTURE RECEIVED NO GROWTH TO DATE CULTURE WILL BE HELD FOR 5 DAYS BEFORE ISSUING A FINAL NEGATIVE REPORT   Report Status PENDING   Incomplete   MRSA PCR SCREENING     Status: Normal   Collection Time   08/26/12  5:10 AM      Component Value Range Status Comment   MRSA by PCR NEGATIVE  NEGATIVE Final      Studies: Dg Chest Port 1 View  08/26/2012  *RADIOLOGY REPORT*  Clinical Data: Line placement.  PORTABLE CHEST - 1 VIEW  Comparison: 08/26/2012  Findings: Right PICC line is in place with the tip in the SVC. Heart is borderline in size.  No confluent airspace opacities or effusions.  No acute bony abnormality.  IMPRESSION: Right PICC line tip in the SVC.  Otherwise no change.   Original Report Authenticated By: Charlett Nose, M.D.    Dg Chest Port 1 View  08/26/2012  *RADIOLOGY REPORT*  Clinical Data: Preop for atrial fibrillation.  Hypertension.  PORTABLE CHEST - 1 VIEW  Comparison: 03/02/2010  Findings: Midline trachea.  Normal heart size for level of inspiration.  Mildly degraded exam due to AP portable technique and patient body habitus.  No definite pleural fluid. No pneumothorax.  Low lung volumes with resultant pulmonary interstitial prominence.  IMPRESSION: Low lung volumes without acute disease.   Original Report Authenticated By: Jeronimo Greaves, M.D.     Scheduled Meds:    . aspirin  81 mg Oral Daily   . furosemide  40 mg Oral Daily  . heparin  5,000 Units Subcutaneous Q8H  . insulin aspart  0-15 Units Subcutaneous TID WC  . insulin aspart protamine-insulin aspart  40 Units Subcutaneous BID WC  . lovastatin  40 mg Oral Daily  . metoprolol tartrate  50 mg Oral BID  . pantoprazole  40 mg Oral Daily  . piperacillin-tazobactam (ZOSYN)  IV  3.375 g Intravenous Q8H  . vancomycin  1,500 mg Intravenous Q12H   Continuous Infusions:   Principal Problem:  *Diabetic foot ulcer with osteomyelitis Active Problems:  Angioedema  Atrial fibrillation  Pre-operative cardiovascular examination  HTN (hypertension)    Time spent: >    Adrian Farrell  [facility] Pager [phone] If 8PM-8AM, please contact night-coverage at [email or web address], password [facility] 08/27/2012, 1:13 PM  LOS: 3 days

## 2012-08-27 NOTE — Op Note (Signed)
Adrian Farrell, Adrian Farrell          ACCOUNT NO.:  [id number]  MEDICAL RECORD NO.:  [id number]  LOCATION:  1434                         FACILITY:  [facility]  PHYSICIAN:  Leonides Grills, M.D.     DATE OF BIRTH:  Mar 27, 1966  DATE OF PROCEDURE:  08/26/2012 DATE OF DISCHARGE:                              OPERATIVE REPORT   PREOPERATIVE DIAGNOSIS:  Osteomyelitis, left 4th toe.  POSTOPERATIVE DIAGNOSIS:  Osteomyelitis, left 4th toe.  OPERATION:  Left 4th toe amputation through MTP joint.  ANESTHESIA:  General.  SURGEON:  Leonides Grills, MD  ASSISTANT:  Richardean Canal, PA.  ESTIMATED BLOOD LOSS:  Minimal.  TOURNIQUET:  None.  COMPLICATIONS:  None.  DISPOSITION:  Stable to PR.  SPECIMEN:  Toe to Pathology.  INDICATION:  This is a 47 year old gentleman, who is an insulin- dependent diabetic, who started developing drainage and open wound on his 4th toe presented to [facility] ED with a white count of 14.  He was admitted to Medicine, was placed on antibiotics and we are consulted for further evaluation and treatment.  He was explained of the risks, infection, vessel injury, more proximal amputation, the fact that his 3rd and 5th toes may develop infection down the road, wound healing problems, DVT, PE, and the fact that he needs to keep his diabetes more controlled were all explained.  Questions were encouraged and answered.  OPERATION:  The patient was brought to the operating room and placed in supine position after adequate general anesthesia administered as well as Ancef 1 g IV piggyback.  Left lower extremity was prepped and draped in sterile manner.  No tourniquet was used.  A racket-shaped incision based dorsally was then made.  Dissection was carried as a full- thickness, retracted down to bone as a full-thickness flap.  The toe was essentially in several pieces from the advanced osteomyelitis.  Once this was carefully dissected out, all the bone was removed, the entire toe  down to the MTP.  We then copiously irrigated the area with normal saline.  There was no obvious gross purulence involving the 4th metatarsal head, however, we did bone probe this area to make sure that there was no abscess in this area.  We copiously irrigated with normal saline.  Wound was closed with 3-0 nylon.  Sterile dressing was applied.  Hard-sole shoe was applied.  The patient was stable to PR.     Leonides Grills, M.D.     PB/MEDQ  D:  08/26/2012  T:  08/27/2012  Job:  [id number]

## 2012-08-28 LAB — GLUCOSE, CAPILLARY
Glucose-Capillary: 105 mg/dL — ABNORMAL HIGH (ref 70–99)
Glucose-Capillary: 156 mg/dL — ABNORMAL HIGH (ref 70–99)
Glucose-Capillary: 169 mg/dL — ABNORMAL HIGH (ref 70–99)
Glucose-Capillary: 187 mg/dL — ABNORMAL HIGH (ref 70–99)

## 2012-08-28 LAB — VANCOMYCIN, TROUGH: Vancomycin Tr: 8.8 ug/mL — ABNORMAL LOW (ref 10.0–20.0)

## 2012-08-28 MED ORDER — VANCOMYCIN HCL 10 G IV SOLR
1750.0000 mg | Freq: Two times a day (BID) | INTRAVENOUS | Status: DC
  Administered 2012-08-28: 1750 mg via INTRAVENOUS
  Filled 2012-08-28 (×2): qty 1750

## 2012-08-28 MED ORDER — CEPHALEXIN 500 MG PO CAPS
500.0000 mg | ORAL_CAPSULE | Freq: Four times a day (QID) | ORAL | Status: DC
  Administered 2012-08-28 – 2012-08-29 (×5): 500 mg via ORAL
  Filled 2012-08-28 (×8): qty 1

## 2012-08-28 NOTE — Progress Notes (Signed)
Subjective:  No further AFIB. Doing well. No complaints. No pain in foot. No SOB.   Objective:  Vital Signs in the last 24 hours: Temp:  [96.9 F (36.1 C)-98.2 F (36.8 C)] 98.2 F (36.8 C) (01/26 0630) Pulse Rate:  [87-99] 90  (01/26 0630) Resp:  [17-20] 18  (01/26 0630) BP: (120-136)/(74-96) 136/96 mmHg (01/26 0630) SpO2:  [98 %-100 %] 98 % (01/26 0630)  Intake/Output from previous day: 01/25 0701 - 01/26 0700 In: 1990 [P.O.:840; IV Piggyback:1150] Out: -    Physical Exam: General: Well developed, well nourished, in no acute distress. Head:  Normocephalic and atraumatic. Lungs: Clear to auscultation and percussion. Heart: Normal S1 and S2.  No murmur, rubs or gallops.  Abdomen: soft, non-tender, positive bowel sounds. Obese Extremities: Chronic 3+ edema, left foot dressed.  Neurologic: Alert and oriented x 3.    Lab Results:  Basename 08/27/12 0525 08/26/12 0755  WBC 9.4 8.7  HGB 9.4* 9.9*  PLT 520* 370    Basename 08/27/12 0525  NA 133*  K 3.9  CL 99  CO2 27  GLUCOSE 253*  BUN 8  CREATININE 0.72   No results found for this basename: TROPONINI:2,CK,MB:2 in the last 72 hours Hepatic Function Panel  Basename 08/27/12 0525  PROT 6.8  ALBUMIN 2.3*  AST 37  ALT 98*  ALKPHOS 93  BILITOT 0.2*  BILIDIR --  IBILI --    Basename 08/27/12 0525  CHOL 149    Telemetry: NSR/Sinus tachy. No AFIB Personally viewed.   Cardiac Studies:  ECHO: - Left ventricle: The cavity size was normal. Wall thickness was increased in a pattern of mild LVH. Systolic function was normal. The estimated ejection fraction was in the range of 55% to 60%. Wall motion was normal; there were no regional wall motion abnormalities. - Left atrium: The atrium was mildly dilated. - Right atrium: The atrium was mildly dilated.    Assessment/Plan:  Principal Problem:  *Diabetic foot ulcer with osteomyelitis Active Problems:  Angioedema  Atrial fibrillation  Pre-operative  cardiovascular examination  HTN (hypertension)  47 year old with osteomyelitis, left 4th toe, s/p amputation on 08/27/11, who was in atrial fibrillation for a short duration on admit now in NSR.   1. AFIB - now in NSR. OK with continuing metoprolol. ASA 81mg . If afib returns, will need full anticoagulation (DM, HTN). Discussed with him.   2. Obesity - morbid, encouraged weight loss  3. Angioedema - ACE-I off. Improved. Avoid ARB as well.   4. HTN - stable. Slightly elevated last check.   5. Osteomyelitis - IV abx. S/p amputation of 4th toe.   6. Edema - lasix. Mobility. Weight loss.   7. Elevated platelets - 520 from 370. Acute phase reactant.     Adrian Farrell, Adrian Farrell 08/28/2012, 7:37 AM

## 2012-08-28 NOTE — Progress Notes (Signed)
Pt has his cpap from home & places on himself when ready.  Jacqulynn Cadet RRT

## 2012-08-28 NOTE — Progress Notes (Signed)
Page to ortho regarding L foot dsg/surgical site. Pt is doing well, denies pain.  Return page,  Freddie Breech instructed to change L toe amputation site with wet to dry dsg bid.

## 2012-08-28 NOTE — Progress Notes (Signed)
[facility] PROGRESS NOTE  RISHIKESH KHACHATRYAN JYN:[id number] DOB: [birth year] DOA: 08/24/2012 PCP: No primary provider on file.  Assessment/Plan: 1. Atrial Fibrillation - Reportedly new onset at this juncture and has resolved. - Echocardiogram ordered and cardiology consult placed.  Would like to thank Cardiology for their input in this case. - If patient does not have any more afib he will be continued on aspirin but afib recurs he will need coumadin therapy.  Discussed with patient. - will monitor one more day on telemetry  2. Diabetic foot ulcer with osteomyelitis: - Contacted Orthopaedic surgery and they have recommended surgical intervention.  I would like to thank Ortho for their input and help in this case. - Will f/u with ortho's recommendations. Pt is s/p day 2 Left 4 toe amputation - currently awaiting ortho's recommendations on disposition from their standpoint. - blood cultures negative.  - Will d/c vancomycin and zosyn and place patient on keflex as his focal source of osteomyelitis has been removed.   3. Angioedema - Resolved with discontinuation of lisinopril.  - At this juncture most likely 2ary to lisinopril ingestion as outpatient. - Have discussed avoiding lisinopril with patient of which he verbalizes understanding and agreement.  4. HTN - Patient is currently on metoprolol at this juncture should help with patient's blood pressure as well as his a fib. - monitor blood pressures and adjust medication pending blood pressure readings. - Cardiology following.   Code Status: full Family Communication: no family at bedside Disposition Plan: Pending further recommendations from specialist.   Consultants:  Ortho  cardiology  Procedures:  Left foot 4th toe amputation  Antibiotics:  Vanc and Zosyn  HPI/Subjective: No new complaints today denies any chest pain or heart palpitations. No acute issues reported overnight. No new complaints.  Is interested  in list of primary care physicians so that he may follow up.  Objective: Filed Vitals:   08/27/12 2017 08/27/12 2036 08/28/12 0630 08/28/12 1000  BP:  134/75 136/96 132/76  Pulse: 87 99 90   Temp:  96.9 F (36.1 C) 98.2 F (36.8 C)   TempSrc:  Oral Oral   Resp: 17 20 18    Height:      Weight:      SpO2:  98% 98%     Intake/Output Summary (Last 24 hours) at 08/28/12 1347 Last data filed at 08/28/12 8657  Gross per 24 hour  Intake   1270 ml  Output      0 ml  Net   1270 ml   Filed Weights   08/25/12 0647 08/25/12 1114  Weight: 171.46 kg (378 lb) 173.728 kg (383 lb)    Exam:   General:  Pt in NAD, Alert and Awake  Cardiovascular: RRR, no murmurs  Respiratory: CTA BL, no wheezes  Abdomen: soft, NT, ND  Extremities: left foot gauze in place no active bleeding.  Data Reviewed: Basic Metabolic Panel:  Lab 08/27/12 8469 08/25/12 0509 08/24/12 2252  NA 133* 129* 132*  K 3.9 4.9 3.7  CL 99 96 97  CO2 27 21 24   GLUCOSE 253* 340* 165*  BUN 8 15 13   CREATININE 0.72 0.93 1.18  CALCIUM 9.0 8.9 8.8  MG -- -- --  PHOS -- -- --   Liver Function Tests:  Lab 08/27/12 0525 08/24/12 2252  AST 37 43*  ALT 98* 95*  ALKPHOS 93 110  BILITOT 0.2* 0.2*  PROT 6.8 7.2  ALBUMIN 2.3* 2.3*   No results found for this basename: LIPASE:5,AMYLASE:5  in the last 168 hours No results found for this basename: AMMONIA:5 in the last 168 hours CBC:  Lab 08/27/12 0525 08/26/12 0755 08/25/12 0509 08/24/12 2252  WBC 9.4 8.7 15.3* 14.1*  NEUTROABS -- -- -- 10.3*  HGB 9.4* 9.9* 10.1* 10.2*  HCT 29.3* 31.3* 31.3* 30.9*  MCV 79.0 77.9* 78.1 78.4  PLT 520* 370 652* 645*   Cardiac Enzymes: No results found for this basename: CKTOTAL:5,CKMB:5,CKMBINDEX:5,TROPONINI:5 in the last 168 hours BNP (last 3 results) No results found for this basename: PROBNP:3 in the last 8760 hours CBG:  Lab 08/28/12 1146 08/28/12 0727 08/27/12 2049 08/27/12 1713 08/27/12 1151  GLUCAP 169* 156* 291* 136*  112*    Recent Results (from the past 240 hour(s))  CULTURE, BLOOD (ROUTINE X 2)     Status: Normal (Preliminary result)   Collection Time   08/25/12 12:35 AM      Component Value Range Status Comment   Specimen Description BLOOD LEFT ANTECUBITAL   Final    Special Requests BOTTLES DRAWN AEROBIC AND ANAEROBIC 6CC   Final    Culture  Setup Time 08/25/2012 04:58   Final    Culture     Final    Value:        BLOOD CULTURE RECEIVED NO GROWTH TO DATE CULTURE WILL BE HELD FOR 5 DAYS BEFORE ISSUING A FINAL NEGATIVE REPORT   Report Status PENDING   Incomplete   CULTURE, BLOOD (ROUTINE X 2)     Status: Normal (Preliminary result)   Collection Time   08/25/12 12:42 AM      Component Value Range Status Comment   Specimen Description BLOOD LEFT HAND   Final    Special Requests BOTTLES DRAWN AEROBIC AND ANAEROBIC [facility]   Final    Culture  Setup Time 08/25/2012 04:58   Final    Culture     Final    Value:        BLOOD CULTURE RECEIVED NO GROWTH TO DATE CULTURE WILL BE HELD FOR 5 DAYS BEFORE ISSUING A FINAL NEGATIVE REPORT   Report Status PENDING   Incomplete   MRSA PCR SCREENING     Status: Normal   Collection Time   08/26/12  5:10 AM      Component Value Range Status Comment   MRSA by PCR NEGATIVE  NEGATIVE Final      Studies: No results found.  Scheduled Meds:    . aspirin  81 mg Oral Daily  . furosemide  40 mg Oral Daily  . heparin  5,000 Units Subcutaneous Q8H  . insulin aspart  0-15 Units Subcutaneous TID WC  . insulin aspart protamine-insulin aspart  40 Units Subcutaneous BID WC  . lovastatin  40 mg Oral Daily  . metoprolol tartrate  50 mg Oral BID  . pantoprazole  40 mg Oral Daily  . piperacillin-tazobactam (ZOSYN)  IV  3.375 g Intravenous Q8H  . vancomycin  1,750 mg Intravenous Q12H   Continuous Infusions:   Principal Problem:  *Diabetic foot ulcer with osteomyelitis Active Problems:  Angioedema  Atrial fibrillation  Pre-operative cardiovascular examination  HTN  (hypertension)    Time spent: >    Adrian Farrell  [facility] Pager [phone] If 8PM-8AM, please contact night-coverage at [email or web address], password [facility] 08/28/2012, 1:47 PM  LOS: 4 days

## 2012-08-28 NOTE — Progress Notes (Signed)
ANTIBIOTIC CONSULT NOTE  Pharmacy Consult for Vancomycin Indication: Diabetic foot infection with osteomyelitis  Allergies  Allergen Reactions  . Lisinopril Swelling    Angioedema    Patient Measurements: Height: 6\' 1"  (185.4 cm) Weight: 383 lb (173.728 kg) IBW/kg (Calculated) : 79.9    Vital Signs: Temp: 98.2 F (36.8 C) (01/26 0630) Temp src: Oral (01/26 0630) BP: 136/96 mmHg (01/26 0630) Pulse Rate: 90  (01/26 0630) Intake/Output from previous day: 01/25 0701 - 01/26 0700 In: 1990 [P.O.:840; IV Piggyback:1150] Out: -  Intake/Output from this shift:    Labs:  Basename 08/27/12 0525 08/26/12 0755  WBC 9.4 8.7  HGB 9.4* 9.9*  PLT 520* 370  LABCREA -- --  CREATININE 0.72 --   Estimated Creatinine Clearance: 189.6 ml/min (by C-G formula based on Cr of 0.72).  Basename 08/28/12 0631  VANCOTROUGH 8.8*  VANCOPEAK --  Drue Dun --  GENTTROUGH --  GENTPEAK --  GENTRANDOM --  TOBRATROUGH --  Nolen Mu --  TOBRARND --  AMIKACINPEAK --  AMIKACINTROU --  AMIKACIN --     Microbiology: 1/23 Blood cultures x2: NG to date  Medical History: Past Medical History  Diagnosis Date  . Hypertension   . Diabetes mellitus     Diagnosed ~2006. has complication as of 1/23, diabetic foot ulcer  . OSA on CPAP     Compliant, diagnosed 2003.    Medications:  Scheduled:     . aspirin  81 mg Oral Daily  . furosemide  40 mg Oral Daily  . heparin  5,000 Units Subcutaneous Q8H  . insulin aspart  0-15 Units Subcutaneous TID WC  . [COMPLETED] insulin aspart  4 Units Subcutaneous Once  . insulin aspart protamine-insulin aspart  40 Units Subcutaneous BID WC  . lovastatin  40 mg Oral Daily  . metoprolol tartrate  50 mg Oral BID  . pantoprazole  40 mg Oral Daily  . piperacillin-tazobactam (ZOSYN)  IV  3.375 g Intravenous Q8H  . vancomycin  1,750 mg Intravenous Q12H  . [DISCONTINUED] vancomycin  1,500 mg Intravenous Q12H   Infusions:    Assessment:  47 y/o M with  diabetic foot infection, osteomyelitis, s/p amputation of L 4th toe 1/24.   Now on D#4 vancomycin 1500 mg IV q12h / Zosyn 3.375 grams IV q8h (extended-infusion).  SCr improved yesterday.    Vancomycin trough subtherapeutic today.   Goal:  Vancomycin trough level 15-20 mcg/ml  Plan:   Increase vancomycin to 1750 mg IV q12h  Continue Zosyn as ordered by MD  Follow serum creatinine  Elie Goody, PharmD, BCPS Pager: [phone] 08/28/2012  8:47 AM

## 2012-08-29 ENCOUNTER — Encounter (HOSPITAL_COMMUNITY): Payer: Self-pay | Admitting: Orthopedic Surgery

## 2012-08-29 LAB — GLUCOSE, CAPILLARY: Glucose-Capillary: 269 mg/dL — ABNORMAL HIGH (ref 70–99)

## 2012-08-29 LAB — BASIC METABOLIC PANEL
GFR calc Af Amer: >90 mL/min (ref >90)
GFR calc non Af Amer: >90 mL/min (ref >90)
Glucose, Bld: 153 mg/dL — ABNORMAL HIGH (ref 70–99)
Potassium: 3.9 mEq/L (ref 3.5–5.1)
Sodium: 136 mEq/L (ref 135–145)

## 2012-08-29 LAB — CBC
Hemoglobin: 9.9 g/dL — ABNORMAL LOW (ref 13.0–17.0)
RBC: 3.88 MIL/uL — ABNORMAL LOW (ref 4.22–5.81)

## 2012-08-29 MED ORDER — CEPHALEXIN 500 MG PO CAPS: 500.0000 mg | ORAL_CAPSULE | Freq: Four times a day (QID) | ORAL | Status: DC

## 2012-08-29 MED ORDER — ASPIRIN 81 MG PO CHEW: 81.0000 mg | CHEWABLE_TABLET | Freq: Every day | ORAL | Status: DC

## 2012-08-29 MED ORDER — METOPROLOL TARTRATE 50 MG PO TABS: 50.0000 mg | ORAL_TABLET | Freq: Two times a day (BID) | ORAL | Status: DC

## 2012-08-29 MED ORDER — HYDROCODONE-ACETAMINOPHEN 5-325 MG PO TABS: 1.0000 | ORAL_TABLET | ORAL | Status: DC | PRN

## 2012-08-29 MED ORDER — LOVASTATIN 40 MG PO TABS: 40.0000 mg | ORAL_TABLET | Freq: Every day | ORAL | Status: AC

## 2012-08-29 NOTE — Progress Notes (Signed)
    Subjective:  Feels ok. Ready to go home. No chest pain, palps, or dyspnea.  Objective:  Vital Signs in the last 24 hours: Temp:  [97.9 F (36.6 C)] 97.9 F (36.6 C) (01/27 0511) Pulse Rate:  [84-97] 84  (01/27 0511) Resp:  [16-24] 16  (01/27 0511) BP: (118-132)/(68-79) 131/79 mmHg (01/27 0511) SpO2:  [97 %-100 %] 98 % (01/27 0511)  Intake/Output from previous day: 01/26 0701 - 01/27 0700 In: 240 [P.O.:240] Out: -   Physical Exam: Pt is alert and oriented, obese male in NAD HEENT: normal Neck: JVP - normal, carotids 2+= without bruits Lungs: CTA bilaterally CV: RRR without murmur or gallop Abd: soft, NT, Positive BS, no hepatomegaly Ext: diffuse edema  Lab Results:  Basename 08/27/12 0525 08/26/12 0755  WBC 9.4 8.7  HGB 9.4* 9.9*  PLT 520* 370    Basename 08/27/12 0525  NA 133*  K 3.9  CL 99  CO2 27  GLUCOSE 253*  BUN 8  CREATININE 0.72   No results found for this basename: TROPONINI:2,CK,MB:2 in the last 72 hours  Tele: Sinus rhythm, artifact noted. No arrhythmia identified.  Assessment/Plan:  1. PAF - no recurrence. Suspect related to stress/allergic reaction/steroid administration. Maintaining sinus rhythm now. Plan as outlined by Dr Anne Fu yesterday. Continue ASA. Anticoagulate if afib recurrence.  2. Obesity - he has lost over 100#. Encouraged to continue working on this.  Other problems appear stable and I would anticipate discharge home today. Should follow-up with PCP for continued tx of obesity, type 2 DM, and HTN.  Tonny Bollman, M.D. 08/29/2012, 7:10 AM

## 2012-08-29 NOTE — Progress Notes (Signed)
Inpatient Diabetes Program Recommendations  AACE/ADA: New Consensus Statement on Inpatient Glycemic Control (2013)  Target Ranges:  Prepandial:   less than 140 mg/dL      Peak postprandial:   less than 180 mg/dL (1-2 hours)      Critically ill patients:  140 - 180 mg/dL   Reason for Visit: WGNF6O of 9.1%  Results for Adrian Farrell, Adrian Farrell (MRN [id number]) as of 08/29/2012 13:18  Ref. Range 08/28/2012 11:46 08/28/2012 17:08 08/28/2012 21:54 08/29/2012 07:25 08/29/2012 11:43  Glucose-Capillary Latest Range: 70-99 mg/dL 696 (H) 295 (H) 284 (H) 148 (H) 269 (H)  Results for Adrian Farrell, Adrian Farrell (MRN [id number]) as of 08/29/2012 13:18  Ref. Range 08/25/2012 05:09  Hemoglobin A1C Latest Range: <5.7 % 9.1 (H)  Results for Adrian Farrell, Adrian Farrell (MRN [id number]) as of 08/29/2012 13:18  Ref. Range 08/29/2012 05:45  Sodium Latest Range: 135-145 mEq/L 136  Potassium Latest Range: 3.5-5.1 mEq/L 3.9  Chloride Latest Range: 96-112 mEq/L 98  CO2 Latest Range: 19-32 mEq/L 29  BUN Latest Range: 6-23 mg/dL 8  Creatinine Latest Range: 0.50-1.35 mg/dL 3.47  Calcium Latest Range: 8.4-10.5 mg/dL 9.4  GFR calc non Af Amer Latest Range: >90 mL/min >90  GFR calc Af Amer Latest Range: >90 mL/min >90  Glucose Latest Range: 70-99 mg/dL 425 (H)   RN called and requested Diabetes Coordinator speak with pt regarding importance of glycemic control.  Pt states he's been without insurance, previously went to [facility], then to [facility] for insulin prescriptions.  Had been on Levemir, but d/t cost, was switched to Novolin 70/30.  Pt states this is affordable for him and takes 40 units bid at home, along with metformin 500 bid.  States HgbA1C has been 6.3%, but this infection has made blood sugars go up.  Lengthy discussion regarding healthy food choices, portion control, and grazing (small meals throughout the day) to assist with glycemic control.  Checks blood sugars 2 - 3 times/day.  Had started an exercise program  and wants to continue using treadmill when foot is healed. Has been to OP Diabetes Education classes and would be open to going again in the future.  Discussed hypoglycemia S/S and treatment.  Pt verbalized understanding and appears to be motivated to make changes that will assist in better blood sugar control.    Discussed above with RN.  Thank you. Ailene Ards, RD, LDN, CDE Inpatient Diabetes Coordinator [phone]

## 2012-08-29 NOTE — Progress Notes (Signed)
Talked to patient about follow up medical care again; he has private insurance that will go into effect 09/03/2012; Patient was going to [facility] practice but is now thinking about going elsewhere. Since patient will have private insurance, he is open to go to any physician that will accept his insurance. 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 B Andre Gallego RN,BSN,MHA.

## 2012-08-29 NOTE — Discharge Summary (Signed)
Physician Discharge Summary  [name] ZOX:[id number] DOB: [birth year] DOA: 08/24/2012  PCP: No primary provider on file.  Admit date: 08/24/2012 Discharge date: 08/29/2012  Time spent: > 35 minutes  Recommendations for Outpatient Follow-up:  1. Will need follow up with orthopaedic surgeon in 2 weeks. Patient to call [phone] for appointments.  Dressing to be changed daily with application of xeroform.  As well as leg elevation and compression stockings to be worn during the day.  Discharge Diagnoses:  Principal Problem:  *Diabetic foot ulcer with osteomyelitis Active Problems:  Angioedema  Atrial fibrillation  Pre-operative cardiovascular examination  HTN (hypertension)   Discharge Condition: stable  Diet recommendation: Carb modified/heart healthy  Filed Weights   08/25/12 0647 08/25/12 1114  Weight: 171.46 kg (378 lb) 173.728 kg (383 lb)    History of present illness:  From original HPI: Adrian Farrell is a 47 y.o. male who presents to the ED with 2 complaints:  The first is upper lip swelling that onset at 1500 today progressively worsening since onset. This occurs in the context of many years of lisinopril. There is no associated tongue swelling nor difficulty breathing.  The second issue which he is very reluctant (to the point of apparently trying to hide it) to talk about, is severe breakdown of the skin between the toes of his L foot, diabetic foot ulcer, and apparent gangrene of the toes. He states this is not painful, further history taken from his wife reveals this has been going on for at least 2 weeks, it occurs in the context of poorly controlled DM2 at home. In the ED, X ray exam revealed osteomyelitis of the 4th toe.  Hospitalist has been asked to admit the patient, who is very hesitant to further discuss need for toe amputation at this time.   Hospital Course:  1. Atrial Fibrillation - Reportedly new onset at this juncture and has resolved.  -  Echocardiogram ordered and cardiology consult placed. Would like to thank Cardiology for their input in this case.  - Patient did not have any more afib as such he will be continued on aspirin but afib recurs he will need coumadin therapy. Discussed with patient.  - Cardiology recommended the following: 1. PAF - no recurrence. Suspect related to stress/allergic reaction/steroid administration. Maintaining sinus rhythm now. Plan as outlined by Adrian Farrell yesterday. Continue ASA. Anticoagulate if afib recurrence.   2. Diabetic foot ulcer with osteomyelitis:  - Contacted Orthopaedic surgery and they have recommended surgical intervention. I would like to thank Ortho for their input and help in this case.  - Will f/u with ortho's recommendations. Pt is s/p day 3 Left 4 toe amputation  - Ortho recommended the following:  Patient will follow-up with Adrian Farrell at 2 weeks post-op.Patient to call [phone] for appointment Dressing be changed daily with application of Xeroform . Steffanie Rainwater shown dressing change by myself today. Elevation of leg encouraged. Compression stockings to be worn during day. Weight bearing as tolerated in Darco shoe left foot. Please call with any questions or concerns [phone].  3. Angioedema  - Resolved with discontinuation of lisinopril.  - At this juncture most likely 2ary to lisinopril ingestion as outpatient.  - Have discussed avoiding lisinopril with patient of which he verbalizes understanding and agreement.   4. HTN  - Well controlled on metoprolol.  Will plan on continuing at this juncture.   Procedures:  Amputation digit left 4th toe thru mtp joint  Consultations:  Orthopaedic surgery: Adrian Farrell  Cardiology: [place]   Discharge Exam: Filed Vitals:   08/28/12 1000 08/28/12 1439 08/28/12 2157 08/29/12 0511  BP: 132/76 118/71 121/68 131/79  Pulse:  93 97 84  Temp:  97.9 F (36.6 C) 97.9 F (36.6 C) 97.9 F (36.6 C)  TempSrc:  Oral Oral Oral  Resp:  18  24 16   Height:      Weight:      SpO2:  100% 97% 98%    General: Pt in NAD, Alert and Awake Cardiovascular: RRR, No MRG Respiratory: CTA BL, no wheezes Abdomen: soft, NT, ND Extremity: LLE foot in bandages with no active bleeding in place.  Discharge Instructions  Discharge Orders    Future Orders Please Complete By Expires   Ambulatory referral to Nutrition and Diabetic Education      Comments:   A1C=9.1%.  Patient is on insulin.  Please set up appointment in February for appt.   Diet - low sodium heart healthy      Increase activity slowly      Discharge instructions      Comments:   Please be sure to follow up with Adrian Farrell as recommended per your discussion with Orthopaedic PA and follow dressing changes as directed.  You are to complete four more days of antibiotic therapy with Keflex and Bactrim.  Please be sure to follow up with PCP and Orthopaedic surgeon after discharge.   Call MD for:  severe uncontrolled pain      Call MD for:  redness, tenderness, or signs of infection (pain, swelling, redness, odor or green/yellow discharge around incision site)      Call MD for:  temperature >100.4      Call MD for:  persistant dizziness or light-headedness      Call MD for:  extreme fatigue          Medication List     As of 08/29/2012  2:20 PM    TAKE these medications         aspirin 81 MG chewable tablet   Chew 1 tablet (81 mg total) by mouth daily.      cephALEXin 500 MG capsule   Commonly known as: KEFLEX   Take 1 capsule (500 mg total) by mouth every 6 (six) hours.      furosemide 40 MG tablet   Commonly known as: LASIX   Take 40 mg by mouth daily.      HYDROcodone-acetaminophen 5-325 MG per tablet   Commonly known as: NORCO/VICODIN   Take 1-2 tablets by mouth every 4 (four) hours as needed.      insulin aspart protamine-insulin aspart (70-30) 100 UNIT/ML injection   Commonly known as: NOVOLOG 70/30   Inject 40 Units into the skin 2 (two) times daily  with a meal.      lovastatin 40 MG tablet   Commonly known as: MEVACOR   Take 1 tablet (40 mg total) by mouth daily.      metFORMIN 500 MG tablet   Commonly known as: GLUCOPHAGE   Take 500 mg by mouth 2 (two) times daily with a meal.      metoprolol 50 MG tablet   Commonly known as: LOPRESSOR   Take 1 tablet (50 mg total) by mouth 2 (two) times daily.      Potassium Gluconate 550 MG Tabs   Take 1 tablet by mouth daily.      sulfamethoxazole-trimethoprim 800-160 MG per tablet   Commonly known as: BACTRIM DS,SEPTRA DS   Take  1 tablet by mouth 2 (two) times daily.          The results of significant diagnostics from this hospitalization (including imaging, microbiology, ancillary and laboratory) are listed below for reference.    Significant Diagnostic Studies: Dg Chest Port 1 View  08/26/2012  *RADIOLOGY REPORT*  Clinical Data: Line placement.  PORTABLE CHEST - 1 VIEW  Comparison: 08/26/2012  Findings: Right PICC line is in place with the tip in the SVC. Heart is borderline in size.  No confluent airspace opacities or effusions.  No acute bony abnormality.  IMPRESSION: Right PICC line tip in the SVC.  Otherwise no change.   Original Report Authenticated By: Charlett Nose, M.D.    Dg Chest Port 1 View  08/26/2012  *RADIOLOGY REPORT*  Clinical Data: Preop for atrial fibrillation.  Hypertension.  PORTABLE CHEST - 1 VIEW  Comparison: 03/02/2010  Findings: Midline trachea.  Normal heart size for level of inspiration.  Mildly degraded exam due to AP portable technique and patient body habitus.  No definite pleural fluid. No pneumothorax.  Low lung volumes with resultant pulmonary interstitial prominence.  IMPRESSION: Low lung volumes without acute disease.   Original Report Authenticated By: Jeronimo Greaves, M.D.    Dg Foot Complete Left  08/24/2012  *RADIOLOGY REPORT*  Clinical Data: Ulcerations at the top of the left foot.  LEFT FOOT - COMPLETE 3+ VIEW  Comparison: None.  Findings: Three  views of the left foot were obtained.  There is lucency and bone loss in the fourth toe proximal phalanx.  There is also marked lucency involving the fourth toe middle phalanx.  There appears to be a dislocation or subluxation involving the fourth toe PIP joint.  The fourth toe is poorly visualized on the lateral view.  There is a prominent spur involving the plantar aspect of the calcaneus.  There appears to be soft tissue edema within the foot.  IMPRESSION: There is lucency and bone loss involving the fourth toe proximal phalanx and middle phalanx.  There is probably a dislocation at the fourth toe PIP joint.  These findings are concerning for osteomyelitis.   Original Report Authenticated By: Richarda Overlie, M.D.     Microbiology: Recent Results (from the past 240 hour(s))  CULTURE, BLOOD (ROUTINE X 2)     Status: Normal (Preliminary result)   Collection Time   08/25/12 12:35 AM      Component Value Range Status Comment   Specimen Description BLOOD LEFT ANTECUBITAL   Final    Special Requests BOTTLES DRAWN AEROBIC AND ANAEROBIC 6CC   Final    Culture  Setup Time 08/25/2012 04:58   Final    Culture     Final    Value:        BLOOD CULTURE RECEIVED NO GROWTH TO DATE CULTURE WILL BE HELD FOR 5 DAYS BEFORE ISSUING A FINAL NEGATIVE REPORT   Report Status PENDING   Incomplete   CULTURE, BLOOD (ROUTINE X 2)     Status: Normal (Preliminary result)   Collection Time   08/25/12 12:42 AM      Component Value Range Status Comment   Specimen Description BLOOD LEFT HAND   Final    Special Requests BOTTLES DRAWN AEROBIC AND ANAEROBIC [facility]   Final    Culture  Setup Time 08/25/2012 04:58   Final    Culture     Final    Value:        BLOOD CULTURE RECEIVED NO GROWTH TO DATE CULTURE WILL  BE HELD FOR 5 DAYS BEFORE ISSUING A FINAL NEGATIVE REPORT   Report Status PENDING   Incomplete   MRSA PCR SCREENING     Status: Normal   Collection Time   08/26/12  5:10 AM      Component Value Range Status Comment   MRSA by PCR  NEGATIVE  NEGATIVE Final      Labs: Basic Metabolic Panel:  Lab 08/29/12 4098 08/27/12 0525 08/25/12 0509 08/24/12 2252  NA 136 133* 129* 132*  K 3.9 3.9 4.9 3.7  CL 98 99 96 97  CO2 29 27 21 24   GLUCOSE 153* 253* 340* 165*  BUN 8 8 15 13   CREATININE 0.77 0.72 0.93 1.18  CALCIUM 9.4 9.0 8.9 8.8  MG -- -- -- --  PHOS -- -- -- --   Liver Function Tests:  Lab 08/27/12 0525 08/24/12 2252  AST 37 43*  ALT 98* 95*  ALKPHOS 93 110  BILITOT 0.2* 0.2*  PROT 6.8 7.2  ALBUMIN 2.3* 2.3*   No results found for this basename: LIPASE:5,AMYLASE:5 in the last 168 hours No results found for this basename: AMMONIA:5 in the last 168 hours CBC:  Lab 08/29/12 0545 08/27/12 0525 08/26/12 0755 08/25/12 0509 08/24/12 2252  WBC 10.0 9.4 8.7 15.3* 14.1*  NEUTROABS -- -- -- -- 10.3*  HGB 9.9* 9.4* 9.9* 10.1* 10.2*  HCT 30.5* 29.3* 31.3* 31.3* 30.9*  MCV 78.6 79.0 77.9* 78.1 78.4  PLT 497* 520* 370 652* 645*   Cardiac Enzymes: No results found for this basename: CKTOTAL:5,CKMB:5,CKMBINDEX:5,TROPONINI:5 in the last 168 hours BNP: BNP (last 3 results) No results found for this basename: PROBNP:3 in the last 8760 hours CBG:  Lab 08/29/12 1143 08/29/12 0725 08/28/12 2154 08/28/12 1708 08/28/12 1146  GLUCAP 269* 148* 187* 169* 169*       Signed:  Penny Pia  [facility] 08/29/2012, 2:20 PM

## 2012-08-29 NOTE — Progress Notes (Signed)
IV team paged to remove PICC for DC home.

## 2012-08-29 NOTE — Progress Notes (Signed)
Pt states he has his own nutritionist and he will set up an appt to see her.

## 2012-08-29 NOTE — Progress Notes (Addendum)
Subjective: 3 Days Post-Op Procedure(s) (LRB): AMPUTATION DIGIT (Left) Patient reports pain as mild.  Patient overall doing well. Dressing has already been taken down by staff.  Objective: Vital signs in last 24 hours: Temp:  [97.9 F (36.6 C)] 97.9 F (36.6 C) (01/27 0511) Pulse Rate:  [84-97] 84  (01/27 0511) Resp:  [16-24] 16  (01/27 0511) BP: (118-131)/(68-79) 131/79 mmHg (01/27 0511) SpO2:  [97 %-100 %] 98 % (01/27 0511)  Intake/Output from previous day: 01/26 0701 - 01/27 0700 In: 240 [P.O.:240] Out: -  Intake/Output this shift:     Basename 08/29/12 0545 08/27/12 0525  HGB 9.9* 9.4*    Basename 08/29/12 0545 08/27/12 0525  WBC 10.0 9.4  RBC 3.88* 3.71*  HCT 30.5* 29.3*  PLT 497* 520*    Basename 08/29/12 0545 08/27/12 0525  NA 136 133*  K 3.9 3.9  CL 98 99  CO2 29 27  BUN 8 8  CREATININE 0.77 0.72  GLUCOSE 153* 253*  CALCIUM 9.4 9.0    Basename 08/26/12 2030  LABPT --  INR 1.03   Left foot Neurovascular intact Wound with anticipated healing . No purulent drainage.   Assessment/Plan: 3 Days Post-Op Procedure(s) (LRB): AMPUTATION DIGIT (Left) 4th toe thru MTP joint  Patient will follow-up with Dr. Lestine Box at 2 weeks post-op.Patient to call [phone] for appointment Dressing be changed daily with application of Xeroform . Steffanie Rainwater shown dressing change by myself today. Elevation of leg encouraged. Compression stockings to be worn during day. Weight bearing as tolerated in Darco shoe left foot.  Please call with any questions or concerns [phone].  Richardean Canal 08/29/2012, 1:45 PM

## 2012-08-31 LAB — CULTURE, BLOOD (ROUTINE X 2): Culture: NO GROWTH

## 2012-08-31 MED FILL — Bupivacaine HCl Preservative Free (PF) Inj 0.5%: INTRAMUSCULAR | Qty: 30 | Status: AC

## 2012-10-30 ENCOUNTER — Encounter (HOSPITAL_COMMUNITY): Payer: Self-pay | Admitting: *Deleted

## 2012-10-30 ENCOUNTER — Emergency Department (HOSPITAL_COMMUNITY): Payer: No Typology Code available for payment source

## 2012-10-30 ENCOUNTER — Emergency Department (HOSPITAL_COMMUNITY)
Admission: EM | Admit: 2012-10-30 | Discharge: 2012-10-30 | Disposition: A | Discharge status: Home / Self Care | Payer: No Typology Code available for payment source | Attending: Emergency Medicine | Admitting: Emergency Medicine

## 2012-10-30 DIAGNOSIS — G4733 Obstructive sleep apnea (adult) (pediatric): Secondary | ICD-10-CM | POA: Insufficient documentation

## 2012-10-30 DIAGNOSIS — Z79899 Other long term (current) drug therapy: Secondary | ICD-10-CM | POA: Insufficient documentation

## 2012-10-30 DIAGNOSIS — R600 Localized edema: Secondary | ICD-10-CM

## 2012-10-30 DIAGNOSIS — R05 Cough: Secondary | ICD-10-CM | POA: Insufficient documentation

## 2012-10-30 DIAGNOSIS — R609 Edema, unspecified: Secondary | ICD-10-CM | POA: Insufficient documentation

## 2012-10-30 DIAGNOSIS — R059 Cough, unspecified: Secondary | ICD-10-CM | POA: Insufficient documentation

## 2012-10-30 DIAGNOSIS — I10 Essential (primary) hypertension: Secondary | ICD-10-CM | POA: Insufficient documentation

## 2012-10-30 DIAGNOSIS — R0989 Other specified symptoms and signs involving the circulatory and respiratory systems: Secondary | ICD-10-CM | POA: Insufficient documentation

## 2012-10-30 DIAGNOSIS — Z9981 Dependence on supplemental oxygen: Secondary | ICD-10-CM | POA: Insufficient documentation

## 2012-10-30 DIAGNOSIS — Z794 Long term (current) use of insulin: Secondary | ICD-10-CM | POA: Insufficient documentation

## 2012-10-30 DIAGNOSIS — J209 Acute bronchitis, unspecified: Secondary | ICD-10-CM | POA: Insufficient documentation

## 2012-10-30 DIAGNOSIS — R0609 Other forms of dyspnea: Secondary | ICD-10-CM | POA: Insufficient documentation

## 2012-10-30 DIAGNOSIS — R0600 Dyspnea, unspecified: Secondary | ICD-10-CM

## 2012-10-30 DIAGNOSIS — E119 Type 2 diabetes mellitus without complications: Secondary | ICD-10-CM | POA: Insufficient documentation

## 2012-10-30 LAB — CBC WITH DIFFERENTIAL/PLATELET
Eosinophils Relative: 1 % (ref 0–5)
HCT: 35.9 % — ABNORMAL LOW (ref 39.0–52.0)
Lymphocytes Relative: 15 % (ref 12–46)
Lymphs Abs: 1.3 10*3/uL (ref 0.7–4.0)
MCV: 76.4 fL — ABNORMAL LOW (ref 78.0–100.0)
Monocytes Absolute: 0.9 10*3/uL (ref 0.1–1.0)
RBC: 4.7 MIL/uL (ref 4.22–5.81)
WBC: 8.2 10*3/uL (ref 4.0–10.5)

## 2012-10-30 LAB — PRO B NATRIURETIC PEPTIDE: Pro B Natriuretic peptide (BNP): 43.7 pg/mL (ref 0–125)

## 2012-10-30 LAB — BASIC METABOLIC PANEL
CO2: 23 mEq/L (ref 19–32)
Calcium: 9.3 mg/dL (ref 8.4–10.5)
Chloride: 102 mEq/L (ref 96–112)
Creatinine, Ser: 0.83 mg/dL (ref 0.50–1.35)
Glucose, Bld: 173 mg/dL — ABNORMAL HIGH (ref 70–99)
Sodium: 138 mEq/L (ref 135–145)

## 2012-10-30 MED ORDER — FUROSEMIDE 10 MG/ML IJ SOLN
60.0000 mg | Freq: Once | INTRAMUSCULAR | Status: AC
  Administered 2012-10-30: 60 mg via INTRAVENOUS
  Filled 2012-10-30: qty 8

## 2012-10-30 MED ORDER — IPRATROPIUM BROMIDE 0.02 % IN SOLN
0.5000 mg | RESPIRATORY_TRACT | Status: DC
  Administered 2012-10-30: 0.5 mg via RESPIRATORY_TRACT
  Filled 2012-10-30: qty 5

## 2012-10-30 MED ORDER — IPRATROPIUM-ALBUTEROL 18-103 MCG/ACT IN AERO: 2.0000 | INHALATION_SPRAY | Freq: Four times a day (QID) | RESPIRATORY_TRACT | Status: AC | PRN

## 2012-10-30 MED ORDER — AEROCHAMBER PLUS FLO-VU LARGE MISC: 1.0000 | Freq: Once | Status: DC

## 2012-10-30 MED ORDER — ALBUTEROL SULFATE (5 MG/ML) 0.5% IN NEBU
5.0000 mg | INHALATION_SOLUTION | RESPIRATORY_TRACT | Status: DC
  Administered 2012-10-30: 5 mg via RESPIRATORY_TRACT
  Filled 2012-10-30: qty 1

## 2012-10-30 NOTE — ED Notes (Signed)
Per ems report, C/o Shortness of breath today. 3 days productive cough. Diaphoretic on arrival, 100% on NRB, diminished with expiratory wheezing. 2nd breathing treatment going at this time (total 10 albuterol/.5 atrovent)

## 2012-10-30 NOTE — ED Notes (Signed)
AVW:[id number]<XB> Expected date:<BR> Expected time:<BR> Means of arrival:<BR> Comments:<BR> Medic 261, 46 M, SOB

## 2012-10-30 NOTE — ED Provider Notes (Signed)
History     CSN: [id number]  Arrival date & time 10/30/12  4098   First MD Initiated Contact with Patient 10/30/12 [phone]      Chief Complaint  Patient presents with  . Shortness of Breath    (Consider location/radiation/quality/duration/timing/severity/associated sxs/prior treatment) HPI Adrian Farrell is a 47 y.o. male history of hypertension, diabetes mellitus, obstructive sleep apnea uses a CPAP at night presents with shortness of breath. Patient also has some lower extremity edema, he says this has not worsened usual, he weighs himself daily and has not gained any weight. He was recently put on doxycycline by his primary care physician. He's been taking his furosemide daily for his peripheral edema. No history of asthma, COPD, he's never been a smoker. Patient also complains about cough has been nonproductive, he says it has a wet sounding doesn't bring anything up. This is been worsening over the course of the last 3-5 days, currently it's severe, has not tried anything else for it. He denies any chest pains, lightheadedness or dizziness, fevers or chills, has had minimal nasal congestion, no itchy watery eyes.  Past Medical History  Diagnosis Date  . Hypertension   . Diabetes mellitus     Diagnosed ~2006. has complication as of 1/23, diabetic foot ulcer  . OSA on CPAP     Compliant, diagnosed 2003.    Past Surgical History  Procedure Laterality Date  . Amputation  08/26/2012    Procedure: AMPUTATION DIGIT;  Surgeon: Sherri Rad, MD;  Location: [facility];  Service: Orthopedics;  Laterality: Left;  Amputation of left  fourth toe    Family History  Problem Relation Age of Onset  . Kidney failure Mother     Related to cocaine  . CAD Neg Hx     History  Substance Use Topics  . Smoking status: Never Smoker   . Smokeless tobacco: Never Used  . Alcohol Use: Yes     Comment: Rare, once every 3-4 months      Review of Systems At least 10pt or greater review of  systems completed and are negative except where specified in the HPI.  Allergies  Lisinopril  Home Medications   Current Outpatient Rx  Name  Route  Sig  Dispense  Refill  . amLODipine (NORVASC) 10 MG tablet   Oral   Take 10 mg by mouth daily.         . carvedilol (COREG) 25 MG tablet   Oral   Take 25 mg by mouth 2 (two) times daily with a meal.         . doxycycline (VIBRA-TABS) 100 MG tablet   Oral   Take 100 mg by mouth 2 (two) times daily.         . furosemide (LASIX) 40 MG tablet   Oral   Take 40 mg by mouth daily.         Marland Kitchen ibuprofen (ADVIL,MOTRIN) 800 MG tablet   Oral   Take 800 mg by mouth 3 (three) times daily. scheduled         . insulin aspart protamine-insulin aspart (NOVOLOG 70/30) (70-30) 100 UNIT/ML injection   Subcutaneous   Inject 40 Units into the skin 2 (two) times daily with a meal.         . lovastatin (MEVACOR) 40 MG tablet   Oral   Take 1 tablet (40 mg total) by mouth daily.   30 tablet   0     BP 134/76  Pulse 96  Temp(Src) 98.3 F (36.8 C) (Oral)  Resp 24  SpO2 100%  Physical Exam  Nursing notes reviewed.  Electronic medical record reviewed. VITAL SIGNS:   Filed Vitals:   10/30/12 0724 10/30/12 0809 10/30/12 0830 10/30/12 0831  BP:   115/76   Pulse:   102   Temp:      TempSrc:      Resp:   22   SpO2: 100% 99% 99% 96%   CONSTITUTIONAL: Awake, oriented x4, no apparent distress, appears non-toxic, morbidly obese HENT: Atraumatic, normocephalic, oral mucosa pink and moist, airway patent. Nares patent without drainage. External ears normal. EYES: Conjunctiva clear, EOMI, PERRLA NECK: Trachea midline, non-tender, supple CARDIOVASCULAR: Normal heart rate, Normal rhythm, No murmurs, rubs, gallops PULMONARY/CHEST: Mild rales at the bases, end expiratory wheezing throughout. Symmetrical breath sounds. Non-tender. ABDOMINAL: Non-distended, soft, non-tender - no rebound or guarding.  BS normal. NEUROLOGIC: Non-focal, moving  all four extremities, no gross sensory or motor deficits. EXTREMITIES: No clubbing, cyanosis. 3+ lower extremity pitting edema SKIN: Warm, Dry, No erythema, No rash  ED Course  Procedures (including critical care time)  Date: 10/30/2012  Rate: 96  Rhythm: normal sinus rhythm  QRS Axis: normal  Intervals: normal  ST/T Wave abnormalities: normal  Conduction Disutrbances: none  Narrative Interpretation: Single PAC, sinus rhythm difference from prior EKG on 08/25/2012 which was read as atrial fibrillation. No ischemic changes     Labs Reviewed  CBC WITH DIFFERENTIAL - Abnormal; Notable for the following:    Hemoglobin 12.1 (*)    HCT 35.9 (*)    MCV 76.4 (*)    MCH 25.7 (*)    All other components within normal limits  BASIC METABOLIC PANEL - Abnormal; Notable for the following:    Glucose, Bld 173 (*)    All other components within normal limits  TROPONIN I  PRO B NATRIURETIC PEPTIDE   Dg Chest 2 View  10/30/2012  *RADIOLOGY REPORT*  Clinical Data: Shortness of breath.  Cough and congestion.  CHEST - 2 VIEW  Comparison: Chest x-ray 08/26/2012.  Findings: Lung volumes are normal.  No consolidative airspace disease.  Mild diffuse peribronchial cuffing.  No evidence of pulmonary edema.  Heart size is normal.  Mediastinal contours are unremarkable.  IMPRESSION: 1.  Mild diffuse peribronchial cuffing may suggest mild bronchitis.   Original Report Authenticated By: Trudie Reed, M.D.      1. Acute bronchitis   2. Dyspnea   3. Bilateral lower extremity edema       MDM  Adrian Farrell is a 47 y.o. male with morbid obesity who presents with acute shortness of breath, patient being treated with doxycycline he is on day 5, obtain screening x-ray which shows likely bronchitis.  BMP is unremarkable, white count is normal. BMP is unremarkable with exception of mildly elevated glucose of 173. Troponin BNP are unremarkable. EKG is unremarkable.  The patient's likely suffering from  bronchitis, no evidence for pneumonia. We'll keep the patient on his doxycycline, he is gotten much better on inhaled bronchodilators, is no longer wheezing, is able to ambulate around the entire emergency department without desaturation or becoming short of breath or tachypneic.  We'll discharge the patient home with a Combivent inhaler for use during periods of bronchitis. Also urged him to try and sleep at 30-45 angle secondary to his weight, he should continue to use CPAP.  Followup with primary provider in 2-3 days followup in the ER if symptoms worsen.  He understands  accepts medical plan as it's been dictated, questions have been answered.         Jones Skene, MD 10/30/12 [phone]

## 2013-11-27 IMAGING — CR DG CHEST 2V
2 series · 2 of 2 positions shown · non-contrast
Comparison: Chest x-ray 08/26/2012.

CLINICAL DATA: Shortness of breath.  Cough and congestion.

CHEST - 2 VIEW

[w chest pa]
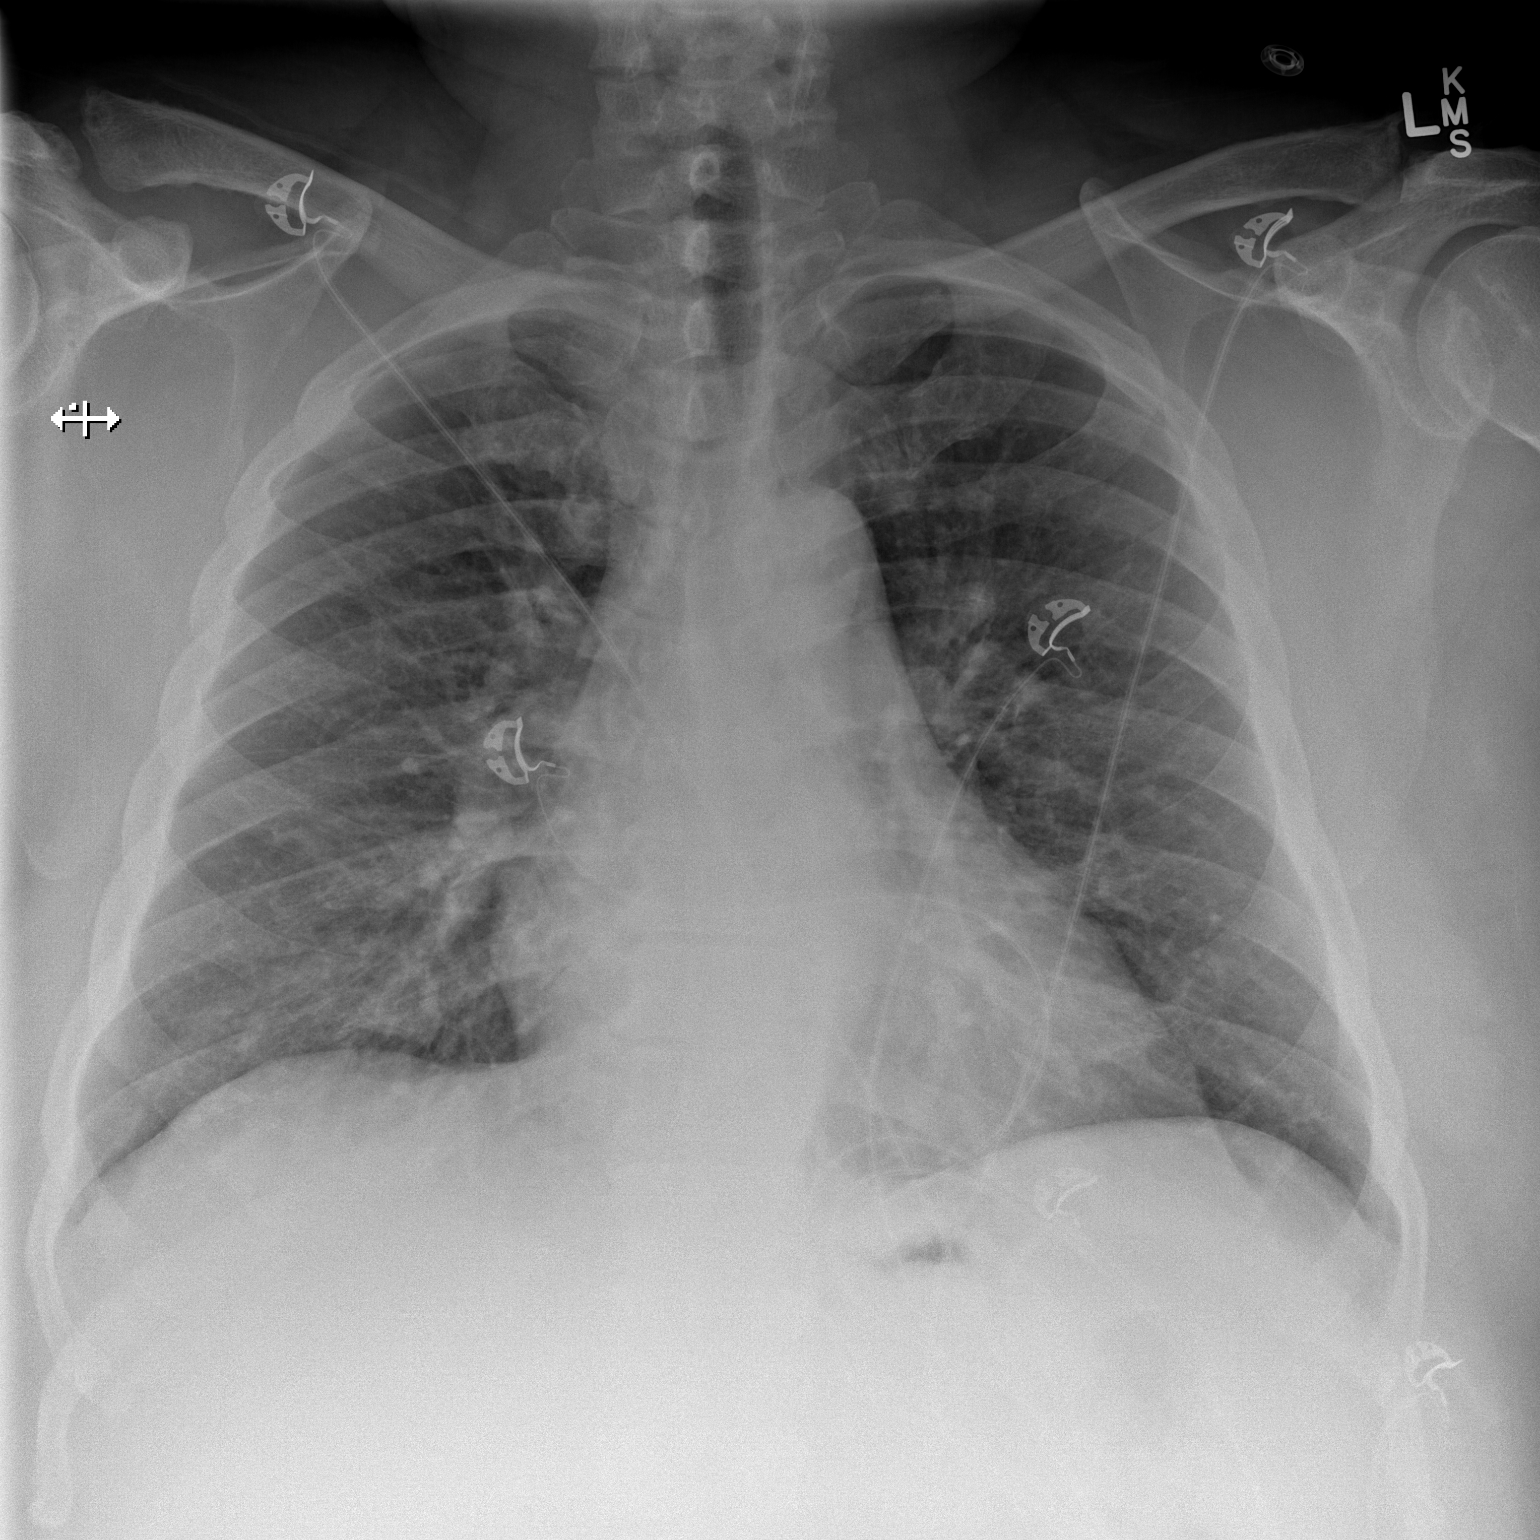

[w chest lat]
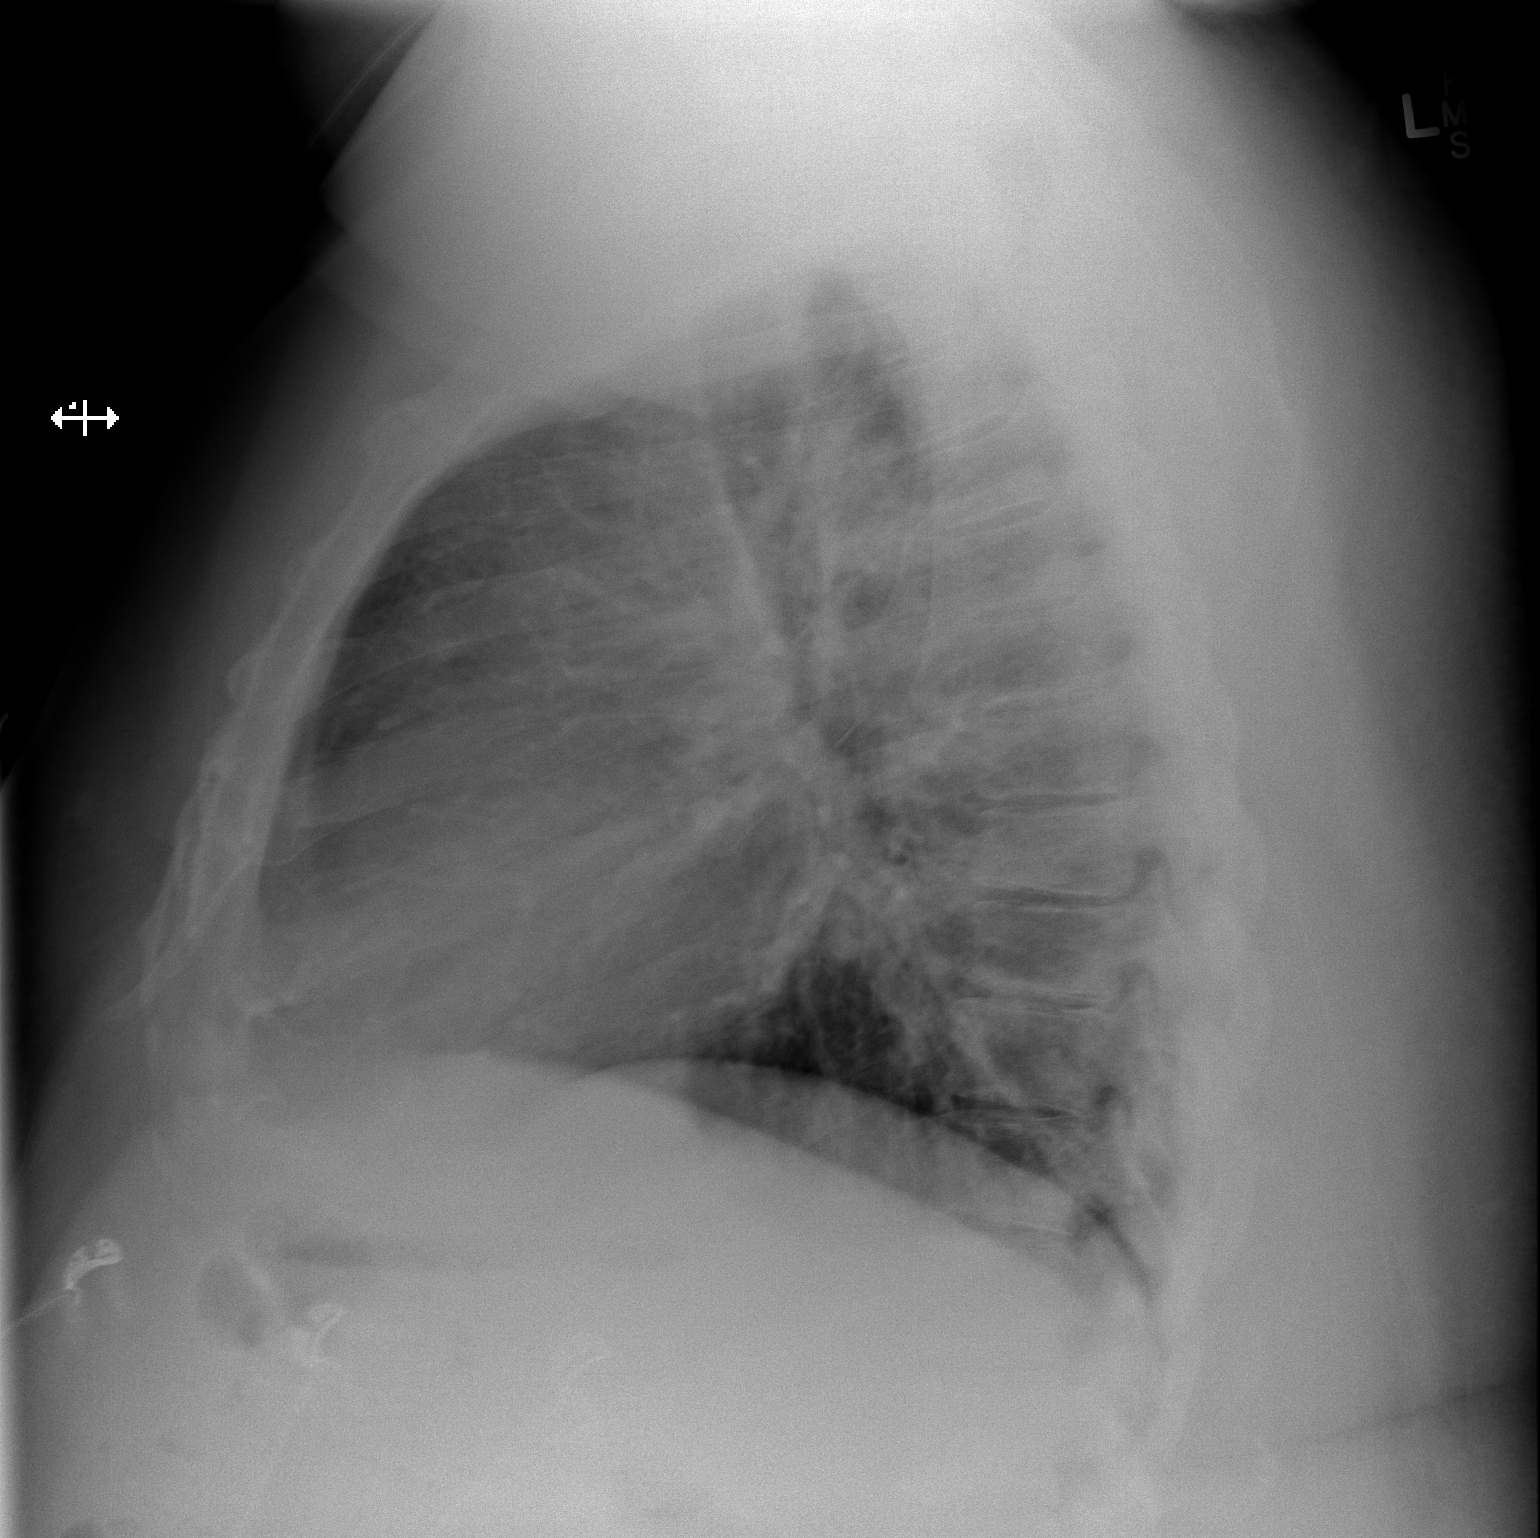

[2 of 2 positions shown; findings below may reference images not displayed]

FINDINGS: Lung volumes are normal.  No consolidative airspace
disease.  Mild diffuse peribronchial cuffing.  No evidence of
pulmonary edema.  Heart size is normal.  Mediastinal contours are
unremarkable.
IMPRESSION: 1.  Mild diffuse peribronchial cuffing may suggest mild bronchitis.

## 2014-03-08 ENCOUNTER — Encounter: Payer: Self-pay | Admitting: *Deleted

## 2020-07-21 LAB — COLOGUARD: COLOGUARD: NEGATIVE

## 2020-07-21 LAB — EXTERNAL GENERIC LAB PROCEDURE: COLOGUARD: NEGATIVE

## 2020-12-03 ENCOUNTER — Telehealth: Payer: Self-pay | Admitting: Orthopedic Surgery

## 2020-12-03 NOTE — Telephone Encounter (Signed)
Pt called stating he needs a new prosthetic limb and was referred to our office because we accept his [employer] insurance. Pt states he has called [facility] to try and get this limb but because of his insurance they won't make it. Pt is wanting to know if he sees DR. [name] first and gets a rx would [facility] have to fill it regardless? Or would we have to find somewhere else that accepts his insurance?

## 2020-12-04 NOTE — Telephone Encounter (Signed)
Message to [facility] will hold message pending return call.

## 2020-12-04 NOTE — Telephone Encounter (Signed)
This is not an insurance that [facility]  does not participate with that insurance and it would be more costly for the pt to use them as an out of network provider. I called the pt and advised he should call his insurance company and find out who is in network and we can refer him there. Pt will call with any other questions.

## 2020-12-10 ENCOUNTER — Ambulatory Visit (INDEPENDENT_AMBULATORY_CARE_PROVIDER_SITE_OTHER): Payer: 59 | Admitting: Physician Assistant

## 2020-12-10 DIAGNOSIS — Z89512 Acquired absence of left leg below knee: Secondary | ICD-10-CM

## 2020-12-10 DIAGNOSIS — S88112A Complete traumatic amputation at level between knee and ankle, left lower leg, initial encounter: Secondary | ICD-10-CM

## 2020-12-10 NOTE — Progress Notes (Signed)
Office Visit Note   Patient: Adrian Farrell           Date of Birth: May 29, 1966           MRN: [id number] Visit Date: 12/10/2020              Requested by: No referring provider defined for this encounter. PCP: No primary care provider on file.  Chief Complaint  Patient presents with  . Left Leg - Follow-up    Hx BKA with another office       HPI: Patient is a pleasant 55 year old gentleman who is 5-1/28-month status post left below-knee amputation done by Dr. Janee Morn.  Patient said he was 1 day away from obtaining his prosthetic when his employer terminated his insurance.  He was unable to obtain the prosthetic.  He has now reestablished insurance and would like to go forward with receiving his prosthetic  Assessment & Plan: Visit Diagnoses: No diagnosis found.  Plan: Prescription was provided to the patient for [facility] who he has been working with.  I will place an order for physical therapy.  He will follow-up with [place] in 2 months sooner if any concerns  Follow-Up Instructions: No follow-ups on file.   Ortho Exam  Patient is alert, oriented, no adenopathy, well-dressed, normal affect, normal respiratory effort. Well-healed left amputation stump.  No erythema no ascending cellulitis no signs of infection swelling is overall well controlled Patient is a new left transtibial  amputee.  Patient's current comorbidities are not expected to impact the ability to function with the prescribed prosthesis. Patient verbally communicates a strong desire to use a prosthesis. Patient currently requires mobility aids to ambulate without a prosthesis.  Expects not to use mobility aids with a new prosthesis.  Patient is a K3 level ambulator that spends a lot of time walking around on uneven terrain over obstacles, up and down stairs, and ambulates with a variable cadence.    Imaging: No results found. No images are attached to the encounter.  Labs: Lab Results  Component Value  Date   HGBA1C 9.1 (H) 08/25/2012   REPTSTATUS 08/31/2012 FINAL 08/25/2012   CULT NO GROWTH 5 DAYS 08/25/2012     Lab Results  Component Value Date   ALBUMIN 2.3 (L) 08/27/2012   ALBUMIN 2.3 (L) 08/24/2012    No results found for: MG No results found for: VD25OH  No results found for: PREALBUMIN CBC EXTENDED Latest Ref Rng & Units 10/30/2012 08/29/2012 08/27/2012  WBC 4.0 - 10.5 K/uL 8.2 10.0 9.4  RBC 4.22 - 5.81 MIL/uL 4.70 3.88(L) 3.71(L)  HGB 13.0 - 17.0 g/dL 12.1(L) 9.9(L) 9.4(L)  HCT 39.0 - 52.0 % 35.9(L) 30.5(L) 29.3(L)  PLT 150 - 400 K/uL 217 497(H) 520(H)  NEUTROABS 1.7 - 7.7 K/uL 6.0 - -  LYMPHSABS 0.7 - 4.0 K/uL 1.3 - -     There is no height or weight on file to calculate BMI.  Orders:  No orders of the defined types were placed in this encounter.  No orders of the defined types were placed in this encounter.    Procedures: No procedures performed  Clinical Data: No additional findings.  ROS:  All other systems negative, except as noted in the HPI. Review of Systems  Objective: Vital Signs: There were no vitals taken for this visit.  Specialty Comments:  No specialty comments available.  PMFS History: Patient Active Problem List   Diagnosis Date Noted  . Atrial fibrillation (HCC) 08/26/2012  . Pre-operative  cardiovascular examination 08/26/2012  . HTN (hypertension) 08/26/2012  . Angioedema 08/25/2012  . Diabetic foot ulcer with osteomyelitis (HCC) 08/25/2012   Past Medical History:  Diagnosis Date  . Diabetes mellitus (HCC)    Diagnosed ~2006. has complication as of 1/23, diabetic foot ulcer  . Hypertension   . OSA on CPAP    Compliant, diagnosed 2003.    Family History  Problem Relation Age of Onset  . Kidney failure Mother        Related to cocaine  . CAD Neg Hx     Past Surgical History:  Procedure Laterality Date  . AMPUTATION  08/26/2012   Procedure: AMPUTATION DIGIT;  Surgeon: Sherri Rad, MD;  Location: [facility];  Service:  Orthopedics;  Laterality: Left;  Amputation of left  fourth toe   Social History   Occupational History  . Not on file  Tobacco Use  . Smoking status: Never Smoker  . Smokeless tobacco: Never Used  Substance and Sexual Activity  . Alcohol use: Yes    Comment: Rare, once every 3-4 months  . Drug use: No  . Sexual activity: Never

## 2021-01-01 ENCOUNTER — Encounter: Payer: 59 | Admitting: Physical Therapy

## 2021-01-07 ENCOUNTER — Telehealth: Payer: Self-pay | Admitting: Orthopedic Surgery

## 2021-01-07 NOTE — Telephone Encounter (Signed)
Received medical records release form from patient    Forwarding to [facility] today

## 2021-01-07 NOTE — Telephone Encounter (Signed)
Received $25.00 cash from patient/Forwarding to [facility] today 

## 2021-01-14 ENCOUNTER — Telehealth: Payer: Self-pay

## 2021-01-20 ENCOUNTER — Encounter: Payer: 59 | Admitting: Physical Therapy

## 2021-02-10 ENCOUNTER — Ambulatory Visit: Payer: 59 | Admitting: Orthopedic Surgery

## 2021-02-10 ENCOUNTER — Encounter: Payer: 59 | Admitting: Physical Therapy

## 2021-02-11 ENCOUNTER — Encounter: Payer: 59 | Admitting: Physical Therapy

## 2021-02-11 ENCOUNTER — Ambulatory Visit (INDEPENDENT_AMBULATORY_CARE_PROVIDER_SITE_OTHER): Payer: 59 | Admitting: Family

## 2021-02-11 ENCOUNTER — Encounter: Payer: Self-pay | Admitting: Family

## 2021-02-11 ENCOUNTER — Other Ambulatory Visit: Payer: Self-pay

## 2021-02-11 DIAGNOSIS — Z89512 Acquired absence of left leg below knee: Secondary | ICD-10-CM

## 2021-02-11 DIAGNOSIS — S88112A Complete traumatic amputation at level between knee and ankle, left lower leg, initial encounter: Secondary | ICD-10-CM

## 2021-02-11 NOTE — Progress Notes (Signed)
Office Visit Note   Patient: Adrian Farrell           Date of Birth: [birth year]           MRN: [id number] Visit Date: 02/11/2021              Requested by: No referring provider defined for this encounter. PCP: No primary care provider on file.  Chief Complaint  Patient presents with   Left Leg - Follow-up      HPI: The patient is a 55 year old gentleman seen status post right below-knee amputation this was done in [place].  He is in a prosthetic doing well has no concerns  He is following with [facility].  Assessment & Plan: Visit Diagnoses: No diagnosis found.  Plan: Plan to follow-up in the office as needed.  Follow-Up Instructions: Return if symptoms worsen or fail to improve.   Ortho Exam  Patient is alert, oriented, no adenopathy, well-dressed, normal affect, normal respiratory effort. On examination of the left residual limb this is well-healed well consolidated he is wearing 2 socks of ply.  No callus no impending ulceration  Imaging: No results found. No images are attached to the encounter.  Labs: Lab Results  Component Value Date   HGBA1C 9.1 (H) 08/25/2012   REPTSTATUS 08/31/2012 FINAL 08/25/2012   CULT NO GROWTH 5 DAYS 08/25/2012     Lab Results  Component Value Date   ALBUMIN 2.3 (L) 08/27/2012   ALBUMIN 2.3 (L) 08/24/2012    No results found for: MG No results found for: VD25OH  No results found for: PREALBUMIN CBC EXTENDED Latest Ref Rng & Units 10/30/2012 08/29/2012 08/27/2012  WBC 4.0 - 10.5 K/uL 8.2 10.0 9.4  RBC 4.22 - 5.81 MIL/uL 4.70 3.88(L) 3.71(L)  HGB 13.0 - 17.0 g/dL 12.1(L) 9.9(L) 9.4(L)  HCT 39.0 - 52.0 % 35.9(L) 30.5(L) 29.3(L)  PLT 150 - 400 K/uL 217 497(H) 520(H)  NEUTROABS 1.7 - 7.7 K/uL 6.0 - -  LYMPHSABS 0.7 - 4.0 K/uL 1.3 - -     There is no height or weight on file to calculate BMI.  Orders:  No orders of the defined types were placed in this encounter.  No orders of the defined types were placed in this  encounter.    Procedures: No procedures performed  Clinical Data: No additional findings.  ROS:  All other systems negative, except as noted in the HPI. Review of Systems  Constitutional: Negative.   Cardiovascular:  Negative for leg swelling.  Skin: Negative.    Objective: Vital Signs: There were no vitals taken for this visit.  Specialty Comments:  No specialty comments available.  PMFS History: Patient Active Problem List   Diagnosis Date Noted   Atrial fibrillation (HCC) 08/26/2012   Pre-operative cardiovascular examination 08/26/2012   HTN (hypertension) 08/26/2012   Angioedema 08/25/2012   Diabetic foot ulcer with osteomyelitis (HCC) 08/25/2012   Past Medical History:  Diagnosis Date   Diabetes mellitus (HCC)    Diagnosed ~2006. has complication as of 1/23, diabetic foot ulcer   Hypertension    OSA on CPAP    Compliant, diagnosed 2003.    Family History  Problem Relation Age of Onset   Kidney failure Mother        Related to cocaine   CAD Neg Hx     Past Surgical History:  Procedure Laterality Date   AMPUTATION  08/26/2012   Procedure: AMPUTATION DIGIT;  Surgeon: Sherri Rad, MD;  Location: [facility];  Service: Orthopedics;  Laterality: Left;  Amputation of left  fourth toe   Social History   Occupational History   Not on file  Tobacco Use   Smoking status: Never   Smokeless tobacco: Never  Substance and Sexual Activity   Alcohol use: Yes    Comment: Rare, once every 3-4 months   Drug use: No   Sexual activity: Never

## 2021-02-18 ENCOUNTER — Encounter: Payer: Self-pay | Admitting: Physical Therapy

## 2021-02-18 ENCOUNTER — Ambulatory Visit (INDEPENDENT_AMBULATORY_CARE_PROVIDER_SITE_OTHER): Payer: 59 | Admitting: Physical Therapy

## 2021-02-18 ENCOUNTER — Other Ambulatory Visit: Payer: Self-pay

## 2021-02-18 DIAGNOSIS — M6281 Muscle weakness (generalized): Secondary | ICD-10-CM | POA: Diagnosis not present

## 2021-02-18 DIAGNOSIS — R2681 Unsteadiness on feet: Secondary | ICD-10-CM | POA: Diagnosis not present

## 2021-02-18 DIAGNOSIS — R293 Abnormal posture: Secondary | ICD-10-CM

## 2021-02-18 DIAGNOSIS — R2689 Other abnormalities of gait and mobility: Secondary | ICD-10-CM | POA: Diagnosis not present

## 2021-02-18 NOTE — Therapy (Signed)
[facility] [address] [place], [place], [address] Phone: [phone]   Fax:  [phone]  Physical Therapy Evaluation  Patient Details  Name: Adrian Farrell MRN: [id number] Date of Birth: 11/27/1965 Referring Provider (PT): Bevely Palmer Persons, [place]   Encounter Date: 02/18/2021   PT End of Session - 02/18/21 0955     Visit Number 1    Number of Visits 20    Date for PT Re-Evaluation 05/01/21    Authorization Type [employer]    Authorization Time Period $60 COPAY, 120 COMBINED PT/OT VISITS, DED MET, OOP MAX $8250 $2641.58 USED/REF#[id number]    Authorization - Visit Number 1    Authorization - Number of Visits 120    PT Start Time 0902    PT Stop Time 0942    PT Time Calculation (min) 40 min    Activity Tolerance Patient tolerated treatment well    Behavior During Therapy [facility] for tasks assessed/performed             Past Medical History:  Diagnosis Date   Diabetes mellitus ([place])    Diagnosed ~3094. has complication as of 0/76, diabetic foot ulcer   Hypertension    OSA on CPAP    Compliant, diagnosed 2003.    Past Surgical History:  Procedure Laterality Date   AMPUTATION  08/26/2012   Procedure: AMPUTATION DIGIT;  Surgeon: Colin Rhein, MD;  Location: [facility];  Service: Orthopedics;  Laterality: Left;  Amputation of left  fourth toe    There were no vitals filed for this visit.    Subjective Assessment - 02/18/21 0912     Subjective This 55yo male was referred to PT by [place], [place] with [phone] (ICD-10-CM) - Below-knee amputation of left lower extremity performed on 07/29/2020 with completion 08/02/2021.  He received his prosthesis on 01/15/2021.    Pertinent History Lt TTA, DM2, neuropathy, HTN, HLD, A-Fib    Patient Stated Goals He wants to walk in community. He wants to be able to work out.    Currently in Pain? No/denies                [facility] PT Assessment - 02/18/21 0905       Assessment    Medical Diagnosis Left Transtibial Amputation    Referring Provider (PT) Bevely Palmer [place], [place]    Onset Date/Surgical Date 01/15/21   prosthesis delivery   Hand Dominance Right      Precautions   Precautions Fall      Balance Screen   Has the patient fallen in the past 6 months No    Has the patient had a decrease in activity level because of a fear of falling?  No    Is the patient reluctant to leave their home because of a fear of falling?  No      Home Environment   Living Environment Private residence   rooming house for [place] entrance   secondary has 6 steps wide rails.   Home Layout One level    [place] - single point;Walker - 2 wheels;Shower seat;Wheelchair - manual      Prior Function   Level of Independence Independent with household mobility without device;Independent with community mobility without device    Leisure working out & running      Posture/Postural Control   Posture/Postural Control Postural limitations  Postural Limitations Rounded Shoulders;Forward head;Flexed trunk;Weight shift right      ROM / Strength   AROM / PROM / Strength PROM;Strength      PROM   Overall PROM  Within functional limits for tasks performed      Strength   Overall Strength Within functional limits for tasks performed      Transfers   Transfers Sit to Stand;Stand to Sit    Sit to Stand 5: Supervision;With upper extremity assist;With armrests;From chair/3-in-1;Other (comment)   touch on external support to stabilize   Stand to Sit 5: Supervision;With upper extremity assist;With armrests;To chair/3-in-1;Other (comment)   touch on external support to stabilize     Ambulation/Gait   Ambulation/Gait Yes    Ambulation/Gait Assistance 5: Supervision    Ambulation Distance (Feet) 100 Feet    Assistive device Straight cane;Prosthesis    Gait Pattern Step-through pattern;Decreased arm swing -  left;Decreased step length - right;Decreased stance time - left;Decreased hip/knee flexion - left;Decreased weight shift to left;Antalgic;Lateral hip instability;Abducted - left;Poor foot clearance - left    Ambulation Surface Level;Indoor    Gait velocity 2.10 ft/sec      Standardized Balance Assessment   Standardized Balance Assessment Berg Balance Test;Dynamic Gait Index      Berg Balance Test   Sit to Stand Able to stand  independently using hands    Standing Unsupported Able to stand 2 minutes with supervision    Sitting with Back Unsupported but Feet Supported on Floor or Stool Able to sit safely and securely 2 minutes    Stand to Sit Controls descent by using hands    Transfers Able to transfer safely, definite need of hands    Standing Unsupported with Eyes Closed Unable to keep eyes closed 3 seconds but stays steady    Standing Unsupported with Feet Together Needs help to attain position and unable to hold for 15 seconds    From Standing, Reach Forward with Outstretched Arm Reaches forward but needs supervision    From Standing Position, Pick up Object from Floor Unable to pick up and needs supervision    From Standing Position, Turn to Look Behind Over each Shoulder Needs supervision when turning    Turn 360 Degrees Needs close supervision or verbal cueing    Standing Unsupported, Alternately Place Feet on Step/Stool Needs assistance to keep from falling or unable to try    Standing Unsupported, One Foot in [employer] balance while stepping or standing    Standing on One Leg Unable to try or needs assist to prevent fall    Total Score 21    Berg comment: BERG  < 36 high risk for falls (close to 100%) 46-51 moderate (>50%)   37-45 significant (>80%) 52-55 lower (> 25%)      Dynamic Gait Index   Level Surface Moderate Impairment    Change in Gait Speed Severe Impairment   loss of balance   Gait with Horizontal Head Turns Severe Impairment   loss of balance   Gait with Vertical  Head Turns Severe Impairment   loss of balance   Gait and Pivot Turn Severe Impairment   loss of balance   Step Over Obstacle Severe Impairment    Step Around Obstacles Severe Impairment   loss of balance   Steps Severe Impairment    Total Score 1    DGI comment: assessed with cane & prosthesis with noted loss of balance with each activity.  Prosthetics Assessment - 02/18/21 0905       Prosthetics   Prosthetic Care Dependent with Skin check;Residual limb care;Care of non-amputated limb;Prosthetic cleaning;Ply sock cleaning;Correct ply sock adjustment;Proper wear schedule/adjustment;Proper weight-bearing schedule/adjustment    Donning prosthesis  Supervision    Doffing prosthesis  Supervision    Current prosthetic wear tolerance (days/week)  daily per patient report for last 2 weeks    Current prosthetic wear tolerance (#hours/day)  up to 6 hours total per day between 2-3 times.    Current prosthetic weight-bearing tolerance (hours/day)  Patient reports no pain or limb discomfort with standing & gait.    Edema pitting    Residual limb condition  no open areas, color & temperature normal, cylinderical shape, no hair growth    Prosthesis Description silicon liner with shuttle pin lock, total contact socket, dynamic response foot with hydraulic ankle.    K code/activity level with prosthetic use  K3 full community with variable cadence                       Objective measurements completed on examination: See above findings.       [facility] Adult PT Treatment/Exercise - 02/18/21 0905       Prosthetics   Prosthetic Care Comments  PT recommended consistent wear 3 hrs 3x/day.    Education Provided Skin check;Residual limb care;Prosthetic cleaning;Correct ply sock adjustment;Proper Donning;Proper wear schedule/adjustment;Other (comment)   see prosthetic care comments   Person(s) Educated Patient    Education Method Explanation;Demonstration;Tactile cues;Verbal  cues    Education Method Verbalized understanding;Tactile cues required;Verbal cues required;Needs further instruction                      PT Short Term Goals - 02/18/21 1009       PT SHORT TERM GOAL #1   Title Patient donnes prosthesis modified independent & verbalizes proper cleaning.    Time 1    Period Months    Status New    Target Date 03/27/21      PT SHORT TERM GOAL #2   Title Patient tolerates prosthesis >12 hrs total /day without skin issues or limb pain    Time 1    Period Months    Status New    Target Date 03/27/21      PT SHORT TERM GOAL #3   Title Patient able to pick up items from floor & reach 10" with cane support safely.    Time 1    Period Months    Status New    Target Date 03/27/21      PT SHORT TERM GOAL #4   Title Patient ambulates 200' with cane & prosthesis with supervision.    Time 1    Period Months    Status New    Target Date 03/27/21      PT SHORT TERM GOAL #5   Title Patient negotiates ramps & curbs with cane & prosthesis with minA.    Time 1    Period Months    Status New    Target Date 03/27/21               PT Long Term Goals - 02/18/21 1006       PT LONG TERM GOAL #1   Title Patient demonstrates & verbalized understanding of prosthetic care to enable safe utilization of prosthesis.    Time 10    Period Weeks    Status New  Target Date 05/01/21      PT LONG TERM GOAL #2   Title Patient tolerates prosthesis wear >90% of awake hours without skin or limb pain issues.    Time 10    Period Weeks    Status New    Target Date 05/01/21      PT LONG TERM GOAL #3   Title Berg Balance >/= 36/56 to indicate lower fall risk    Time 10    Period Weeks    Status New    Target Date 05/01/21      PT LONG TERM GOAL #4   Title Patient ambulates 300' with LRAD & prosthesis modified independent    Time 10    Period Weeks    Status New    Target Date 05/01/21      PT LONG TERM GOAL #5   Title Patient  negotiates ramps, curbs & stairs with LRAD & prosthesis modified independent.    Time 10    Period Weeks    Status New    Target Date 05/01/21      PT LONG TERM GOAL #6   Title Dynamic Gait Index with cane & prosthesis >/= 12 / 24    Time 10    Period Weeks    Status New    Target Date 05/01/21                    Plan - 02/18/21 0959     Clinical Impression Statement This 55yo male underwent a left Transtibial Amputation on 07/29/2020 and received his first prosthesis on 01/15/2021. He is dependent in proper prosthetic care which increases risk of skin issues. Patient is  wearing prosthesis up to 6 hours or <33% of awake hours which limits function during his day & increases fall risk when prosthesis off. Berg Balance score of 21 /56 indicates high fall risk and dependency in standing ADLs. Dynamic Gait Index with cane & prosthesis of 1 / 24 with losses of balance with scanning or negotiating obstacles indicates high fall risk. Patient would benefit from skilled PT to improve function & safety with his prosthesis.    Personal Factors and Comorbidities Comorbidity 3+;Past/Current Experience;Time since onset of injury/illness/exacerbation;Fitness    Comorbidities Lt TTA, DM2, neuropathy, HTN, HLD, A-Fib    Examination-Activity Limitations Lift;Locomotion Level;Squat;Stairs;Stand;Transfers    Examination-Participation Restrictions Community Activity    Stability/Clinical Decision Making Evolving/Moderate complexity    Clinical Decision Making Moderate    Rehab Potential Good    PT Frequency 2x / week   PT recommended 2x/wk but high co-pay of $60 / visit required to reduce to 1x/wk   PT Duration Other (comment)   10 weeks   PT Treatment/Interventions ADLs/Self Care Home Management;DME Instruction;Gait training;Stair training;Functional mobility training;Therapeutic activities;Therapeutic exercise;Balance training;Neuromuscular re-education;Patient/family education;Prosthetic  Training;Vestibular    PT Next Visit Plan review prosthetic care,  set up HEP,  prosthetic gait with cane including ramps & curbs    Consulted and Agree with Plan of Care Patient             Patient will benefit from skilled therapeutic intervention in order to improve the following deficits and impairments:  Abnormal gait, Decreased activity tolerance, Decreased balance, Decreased endurance, Decreased knowledge of use of DME, Decreased mobility, Decreased strength, Postural dysfunction, Prosthetic Dependency, Obesity  Visit Diagnosis: Unsteadiness on feet  Other abnormalities of gait and mobility  Abnormal posture  Muscle weakness (generalized)     Problem List Patient Active Problem  List   Diagnosis Date Noted   Atrial fibrillation ([place]) 08/26/2012   Pre-operative cardiovascular examination 08/26/2012   HTN (hypertension) 08/26/2012   Angioedema 08/25/2012   Diabetic foot ulcer with osteomyelitis ([place]) 08/25/2012    Jamey Reas, PT, DPT 02/18/2021, 10:12 AM  [facility] Physical Therapy [address] [place], [place], [address] Phone: [phone]   Fax:  [phone]  Name: JAVEL HERSH MRN: [id number] Date of Birth: [birth year]

## 2021-02-25 ENCOUNTER — Ambulatory Visit (INDEPENDENT_AMBULATORY_CARE_PROVIDER_SITE_OTHER): Payer: 59 | Admitting: Physical Therapy

## 2021-02-25 ENCOUNTER — Encounter: Payer: Self-pay | Admitting: Physical Therapy

## 2021-02-25 ENCOUNTER — Other Ambulatory Visit: Payer: Self-pay

## 2021-02-25 DIAGNOSIS — M6281 Muscle weakness (generalized): Secondary | ICD-10-CM

## 2021-02-25 DIAGNOSIS — R293 Abnormal posture: Secondary | ICD-10-CM

## 2021-02-25 DIAGNOSIS — R2689 Other abnormalities of gait and mobility: Secondary | ICD-10-CM | POA: Diagnosis not present

## 2021-02-25 DIAGNOSIS — R2681 Unsteadiness on feet: Secondary | ICD-10-CM

## 2021-02-25 NOTE — Patient Instructions (Addendum)
Do each exercise 1-2  times per day Do each exercise 10 repetitions Hold each exercise for 2 seconds to feel your location  AT SINK FIND YOUR MIDLINE POSITION AND PLACE FEET EQUAL DISTANCE FROM THE MIDLINE.  Try to find this position when standing still for activities.   USE TAPE ON FLOOR TO MARK THE MIDLINE POSITION. You also should try to feel with your limb pressure in socket.  You are trying to feel with limb what you used to feel with the bottom of your foot.  Side to Side Shift: Moving your hips only (not shoulders): move weight onto your left leg, HOLD/FEEL.  Move back to equal weight on each leg, HOLD/FEEL. Move weight onto your right leg, HOLD/FEEL. Move back to equal weight on each leg, HOLD/FEEL. Repeat.  Start with both hands on sink, progress to left hand only, then no hands.  Front to Back Shift: Moving your hips only (not shoulders): move your weight forward onto your toes, HOLD/FEEL. Move your weight back to equal Flat Foot on both legs, HOLD/FEEL. Move your weight back onto your heels, HOLD/FEEL. Move your weight back to equal on both legs, HOLD/FEEL. Repeat.   start with both hands on sink, progress to left hand only, then no hands. Moving Cones / Cups: With equal weight on each leg: Hold on with one hand the first time, then progress to no hand supports. Move cups from one side of sink to the other. Place cups ~2" out of your reach, progress to 10" beyond reach.  Place one hand in middle of sink and reach with other hand. Do both arms.  Then hover one hand and move cups with other hand.  Overhead/Upward Reaching: alternated reaching up to top cabinets or ceiling if no cabinets present. Keep equal weight on each leg. Start with one hand support on counter while other hand reaches and progress to no hand support with reaching.  ace one hand in middle of sink and reach with other hand. Do both arms.  Then hover one hand and move cups with other hand.  5.   Looking Over Shoulders: With  equal weight on each leg: alternate turning to look over your shoulders with one hand support on counter as needed.  Start with head motions only to look in front of shoulder, then even with shoulder and progress to looking behind you. To look to side, move head /eyes, then shoulder on side looking pulls back, shift more weight to side looking and pull hip back. ace one hand in middle of sink and let go with other hand so your shoulder can pull back. Switch hands to look other way.   Then hover one hand and move cups with other hand.  6.  Stepping with leg that is not amputated:  Move items under cabinet out of your way. Shift your hips/pelvis so weight on prosthesis. SLOWLY step other leg so front of foot is in cabinet. Then step back to floor.       Access Code: [email or web address] URL: [email or web address] Date: 02/25/2021 Prepared by: Vladimir Faster  Exercises Standing hip abduction alternating legs - 1 x daily - 7 x weekly - 1 sets - 15 reps - 2 seconds hold Standing hip extension alternating legs - 1 x daily - 7 x weekly - 1 sets - 15 reps - 2 seconds hold Alternating Hip Flexion Forward Kicks - 1 x daily - 7 x weekly - 1 sets - 15 reps - 2 seconds hold

## 2021-02-25 NOTE — Therapy (Signed)
[facility] Physical Therapy [address] [place], [place], [address] Phone: [phone]   Fax:  [phone]  Physical Therapy Treatment  Patient Details  Name: Adrian Farrell MRN: [id number] Date of Birth: 28-Jul-1966 Referring Provider (PT): Bevely Palmer Persons, [place]   Encounter Date: 02/25/2021   PT End of Session - 02/25/21 1100     Visit Number 2    Number of Visits 20    Date for PT Re-Evaluation 05/01/21    Authorization Type [employer]    Authorization Time Period $60 COPAY, 120 COMBINED PT/OT VISITS, DED MET, OOP MAX $8250 $9826.41 USED/REF#[id number]    Authorization - Visit Number 2    Authorization - Number of Visits 120    PT Start Time 1100    PT Stop Time 1145    PT Time Calculation (min) 45 min    Activity Tolerance Patient tolerated treatment well    Behavior During Therapy [facility] for tasks assessed/performed             Past Medical History:  Diagnosis Date   Diabetes mellitus ([place])    Diagnosed ~5830. has complication as of 9/40, diabetic foot ulcer   Hypertension    OSA on CPAP    Compliant, diagnosed 2003.    Past Surgical History:  Procedure Laterality Date   AMPUTATION  08/26/2012   Procedure: AMPUTATION DIGIT;  Surgeon: Colin Rhein, MD;  Location: [facility];  Service: Orthopedics;  Laterality: Left;  Amputation of left  fourth toe    There were no vitals filed for this visit.   Subjective Assessment - 02/25/21 1100     Subjective He has been wearing prosthesis daily for most of awake hours.    Pertinent History Lt TTA, DM2, neuropathy, HTN, HLD, A-Fib    Patient Stated Goals He wants to walk in community. He wants to be able to work out.    Currently in Pain? No/denies                               [facility] Adult PT Treatment/Exercise - 02/25/21 1100       Ambulation/Gait   Ambulation/Gait Yes    Ambulation/Gait Assistance 5: Supervision    Assistive device Straight cane;Prosthesis       Exercises   Exercises Knee/Hip;Other Exercises    Other Exercises  PT demo & verbal cues on posture & setup for upper body weight machines.  pt verbalized understanding.      Knee/Hip Exercises: Machines for Strengthening   Cybex Leg Press shuttle leg press 125# 15 reps.  PT verbal cues on set up.      Prosthetics   Current prosthetic wear tolerance (days/week)  daily    Current prosthetic wear tolerance (#hours/day)  reports most of day but removes for 1-2 hours ~3 times per day    Edema pitting    Residual limb condition  no open areas, color & temperature normal, cylinderical shape, no hair growth    Education Provided Proper wear schedule/adjustment    Person(s) Educated Patient    Education Method Explanation;Verbal cues    Education Method Verbalized understanding;Needs further instruction                    PT Education - 02/25/21 1138     Education Details Access Code: [email or web address]  and  sink HEP    Person(s) Educated Patient    Methods Explanation;Demonstration;Tactile cues;Verbal cues;Handout  Comprehension Verbalized understanding;Returned demonstration;Verbal cues required;Tactile cues required              PT Short Term Goals - 02/18/21 1009       PT SHORT TERM GOAL #1   Title Patient donnes prosthesis modified independent & verbalizes proper cleaning.    Time 1    Period Months    Status New    Target Date 03/27/21      PT SHORT TERM GOAL #2   Title Patient tolerates prosthesis >12 hrs total /day without skin issues or limb pain    Time 1    Period Months    Status New    Target Date 03/27/21      PT SHORT TERM GOAL #3   Title Patient able to pick up items from floor & reach 10" with cane support safely.    Time 1    Period Months    Status New    Target Date 03/27/21      PT SHORT TERM GOAL #4   Title Patient ambulates 200' with cane & prosthesis with supervision.    Time 1    Period Months    Status New    Target Date 03/27/21       PT SHORT TERM GOAL #5   Title Patient negotiates ramps & curbs with cane & prosthesis with minA.    Time 1    Period Months    Status New    Target Date 03/27/21               PT Long Term Goals - 02/18/21 1006       PT LONG TERM GOAL #1   Title Patient demonstrates & verbalized understanding of prosthetic care to enable safe utilization of prosthesis.    Time 10    Period Weeks    Status New    Target Date 05/01/21      PT LONG TERM GOAL #2   Title Patient tolerates prosthesis wear >90% of awake hours without skin or limb pain issues.    Time 10    Period Weeks    Status New    Target Date 05/01/21      PT LONG TERM GOAL #3   Title Berg Balance >/= 36/56 to indicate lower fall risk    Time 10    Period Weeks    Status New    Target Date 05/01/21      PT LONG TERM GOAL #4   Title Patient ambulates 300' with LRAD & prosthesis modified independent    Time 10    Period Weeks    Status New    Target Date 05/01/21      PT LONG TERM GOAL #5   Title Patient negotiates ramps, curbs & stairs with LRAD & prosthesis modified independent.    Time 10    Period Weeks    Status New    Target Date 05/01/21      PT LONG TERM GOAL #6   Title Dynamic Gait Index with cane & prosthesis >/= 12 / 24    Time 10    Period Weeks    Status New    Target Date 05/01/21                   Plan - 02/25/21 1100     Clinical Impression Statement Patient reports he has returned to gym for some weight machines.  PT instructed in proper set up with  prosthesis for upper body & leg press machines which he verbalizes understanding.  PT instructed in HEP for proprioception, balance & standing tolerance which he appears to understand.    Personal Factors and Comorbidities Comorbidity 3+;Past/Current Experience;Time since onset of injury/illness/exacerbation;Fitness    Comorbidities Lt TTA, DM2, neuropathy, HTN, HLD, A-Fib    Examination-Activity Limitations Lift;Locomotion  Level;Squat;Stairs;Stand;Transfers    Examination-Participation Restrictions Community Activity    Stability/Clinical Decision Making Evolving/Moderate complexity    Rehab Potential Good    PT Frequency 2x / week   PT recommended 2x/wk but high co-pay of $60 / visit required to reduce to 1x/wk   PT Duration Other (comment)   10 weeks   PT Treatment/Interventions ADLs/Self Care Home Management;DME Instruction;Gait training;Stair training;Functional mobility training;Therapeutic activities;Therapeutic exercise;Balance training;Neuromuscular re-education;Patient/family education;Prosthetic Training;Vestibular    PT Next Visit Plan review prosthetic care,  check HEP,  prosthetic gait with cane including ramps & curbs, instruct  how to pick up items from floor    PT Home Exercise Plan Access Code: [email or web address] and sink HEP    Consulted and Agree with Plan of Care Patient             Patient will benefit from skilled therapeutic intervention in order to improve the following deficits and impairments:  Abnormal gait, Decreased activity tolerance, Decreased balance, Decreased endurance, Decreased knowledge of use of DME, Decreased mobility, Decreased strength, Postural dysfunction, Prosthetic Dependency, Obesity  Visit Diagnosis: Unsteadiness on feet  Other abnormalities of gait and mobility  Abnormal posture  Muscle weakness (generalized)     Problem List Patient Active Problem List   Diagnosis Date Noted   Atrial fibrillation ([place]) 08/26/2012   Pre-operative cardiovascular examination 08/26/2012   HTN (hypertension) 08/26/2012   Angioedema 08/25/2012   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

## 2021-03-04 ENCOUNTER — Encounter: Payer: Self-pay | Admitting: Physical Therapy

## 2021-03-04 ENCOUNTER — Ambulatory Visit: Payer: 59 | Admitting: Physical Therapy

## 2021-03-04 ENCOUNTER — Other Ambulatory Visit: Payer: Self-pay

## 2021-03-04 ENCOUNTER — Encounter: Payer: 59 | Admitting: Physical Therapy

## 2021-03-04 DIAGNOSIS — R2689 Other abnormalities of gait and mobility: Secondary | ICD-10-CM | POA: Diagnosis not present

## 2021-03-04 DIAGNOSIS — M6281 Muscle weakness (generalized): Secondary | ICD-10-CM

## 2021-03-04 DIAGNOSIS — R293 Abnormal posture: Secondary | ICD-10-CM | POA: Diagnosis not present

## 2021-03-04 DIAGNOSIS — R2681 Unsteadiness on feet: Secondary | ICD-10-CM | POA: Diagnosis not present

## 2021-03-04 NOTE — Therapy (Signed)
[facility] Physical Therapy [address] [place], [place], [address] Phone: [phone]   Fax:  [phone]  Physical Therapy Treatment  Patient Details  Name: Adrian Farrell MRN: [id number] Date of Birth: 1965/10/29 Referring Provider (PT): Bevely Palmer Persons, [place]   Encounter Date: 03/04/2021   PT End of Session - 03/04/21 1012     Visit Number 3    Number of Visits 20    Date for PT Re-Evaluation 05/01/21    Authorization Type [employer]    Authorization Time Period $60 COPAY, 120 COMBINED PT/OT VISITS, DED MET, OOP MAX $8250 $9379.02 USED/REF#[id number]    Authorization - Visit Number 3    Authorization - Number of Visits 120    PT Start Time 1012    PT Stop Time 1055    PT Time Calculation (min) 43 min    Activity Tolerance Patient tolerated treatment well    Behavior During Therapy [facility] for tasks assessed/performed             Past Medical History:  Diagnosis Date   Diabetes mellitus ([place])    Diagnosed ~4097. has complication as of 3/53, diabetic foot ulcer   Hypertension    OSA on CPAP    Compliant, diagnosed 2003.    Past Surgical History:  Procedure Laterality Date   AMPUTATION  08/26/2012   Procedure: AMPUTATION DIGIT;  Surgeon: Colin Rhein, MD;  Location: [facility];  Service: Orthopedics;  Laterality: Left;  Amputation of left  fourth toe    There were no vitals filed for this visit.   Subjective Assessment - 03/04/21 1014     Subjective The exercises are going well.  He has been going to gym which is going well.  He is wearing prosthesis most of awake hours without issues.    Pertinent History Lt TTA, DM2, neuropathy, HTN, HLD, A-Fib    Patient Stated Goals He wants to walk in community. He wants to be able to work out.    Currently in Pain? No/denies                               [facility] Adult PT Treatment/Exercise - 03/04/21 1015       Ambulation/Gait   Ambulation/Gait Yes    Ambulation/Gait  Assistance 5: Supervision    Ambulation Distance (Feet) 150 Feet    Assistive device Straight cane;Prosthesis    Stairs Yes    Stairs Assistance 5: Supervision    Stairs Assistance Details (indicate cue type and reason) PT demo & verbal cues on technique with prosthesis & alternating pattern including rationale.  PT recommended 1 time per day with rail & cane.    Stair Management Technique One rail Left;With cane;Alternating pattern;Forwards    Number of Stairs 11    Height of Stairs 6    Ramp 5: Supervision   cane & TTA prosthesis   Ramp Details (indicate cue type and reason) PT demo & verbal cues on technique with prosthesis    Curb 5: Supervision   cane & TTA prosthesis   Curb Details (indicate cue type and reason) PT demo & verbal cues on technique with prosthesis      Therapeutic Activites    Lifting PT demo & verbal cues on picking up items from floor. pt return demo understanding. PT recommended practicing with chair behind & support in front. pt verbalized understanding.      Exercises   Exercises  Knee/Hip;Other Exercises    Other Exercises  --      Knee/Hip Exercises: Machines for Strengthening   Cybex Leg Press --      Prosthetics   Current prosthetic wear tolerance (days/week)  daily    Current prosthetic wear tolerance (#hours/day)  reports most of day but removes for 1-2 hours ~1 times per day    Edema pitting    Residual limb condition  no open areas, color & temperature normal, cylinderical shape, no hair growth    Education Provided Proper wear schedule/adjustment    Person(s) Educated Patient    Education Method Explanation;Verbal cues    Education Method Verbalized understanding                 Balance Exercises - 03/04/21 1015       Balance Exercises: Standing   Standing Eyes Opened Wide ([place]);Head turns;Solid surface;Foam/compliant surface;5 reps   feet hip width on floor & wide stance on foam   Standing Eyes Opened Limitations head turns  right/left, up/down & diagonals.  intermittent touch needed.  tactile cues for balance reactions.    Standing Eyes Closed Wide ([place]);Solid surface;Head turns;5 reps    Standing Eyes Closed Limitations head turns right/left, up/down & diagonals.  intermittent touch needed.  tactile cues for balance reactions.    Tandem Stance Eyes open;Intermittent upper extremity support;30 secs;2 reps   1st rep prosthesis in front & 2nd rep prosthesis in back   Tandem Stance Limitations counting to 30 stopping when touching    Stepping Strategy Anterior;Posterior;5 reps   stepping off towel roll with goal to stabilize without touching.   Stepping Strategy Limitations anticipatory stepping improved a little with repetition.               PT Education - 03/04/21 1050     Education Details updated HEP Access Code: [email or web address]   need to work endurance, strength, flexibility & balance during week    Person(s) Educated Patient    Methods Explanation;Demonstration;Tactile cues;Verbal cues;Handout    Comprehension Verbalized understanding;Returned demonstration;Verbal cues required;Tactile cues required;Need further instruction              PT Short Term Goals - 03/04/21 1013       PT SHORT TERM GOAL #1   Title Patient donnes prosthesis modified independent & verbalizes proper cleaning.    Time 1    Period Months    Status On-going    Target Date 03/27/21      PT SHORT TERM GOAL #2   Title Patient tolerates prosthesis >12 hrs total /day without skin issues or limb pain    Time 1    Period Months    Status On-going    Target Date 03/27/21      PT SHORT TERM GOAL #3   Title Patient able to pick up items from floor & reach 10" with cane support safely.    Time 1    Period Months    Status On-going    Target Date 03/27/21      PT SHORT TERM GOAL #4   Title Patient ambulates 200' with cane & prosthesis with supervision.    Time 1    Period Months    Status On-going    Target Date 03/27/21       PT SHORT TERM GOAL #5   Title Patient negotiates ramps & curbs with cane & prosthesis with minA.    Time 1    Period Months  Status On-going    Target Date 03/27/21               PT Long Term Goals - 03/04/21 1012       PT LONG TERM GOAL #1   Title Patient demonstrates & verbalized understanding of prosthetic care to enable safe utilization of prosthesis.    Time 10    Period Weeks    Status On-going    Target Date 05/01/21      PT LONG TERM GOAL #2   Title Patient tolerates prosthesis wear >90% of awake hours without skin or limb pain issues.    Time 10    Period Weeks    Status On-going    Target Date 05/01/21      PT LONG TERM GOAL #3   Title Berg Balance >/= 36/56 to indicate lower fall risk    Time 10    Period Weeks    Status On-going    Target Date 05/01/21      PT LONG TERM GOAL #4   Title Patient ambulates 300' with LRAD & prosthesis modified independent    Time 10    Period Weeks    Status On-going    Target Date 05/01/21      PT LONG TERM GOAL #5   Title Patient negotiates ramps, curbs & stairs with LRAD & prosthesis modified independent.    Time 10    Period Weeks    Status On-going    Target Date 05/01/21      PT LONG TERM GOAL #6   Title Dynamic Gait Index with cane & prosthesis >/= 12 / 24    Time 10    Period Weeks    Status On-going    Target Date 05/01/21                   Plan - 03/04/21 1012     Clinical Impression Statement PT added more balance activities to HEP to be done in corner for safe environment.  PT educated on picking up items from floor with TTA prosthesis & he seems to have basic understanding.  PT also worked on ramps, curbs & stairs.    Personal Factors and Comorbidities Comorbidity 3+;Past/Current Experience;Time since onset of injury/illness/exacerbation;Fitness    Comorbidities Lt TTA, DM2, neuropathy, HTN, HLD, A-Fib    Examination-Activity Limitations Lift;Locomotion  Level;Squat;Stairs;Stand;Transfers    Examination-Participation Restrictions Community Activity    Stability/Clinical Decision Making Evolving/Moderate complexity    Rehab Potential Good    PT Frequency 2x / week   PT recommended 2x/wk but high co-pay of $60 / visit required to reduce to 1x/wk   PT Duration Other (comment)   10 weeks   PT Treatment/Interventions ADLs/Self Care Home Management;DME Instruction;Gait training;Stair training;Functional mobility training;Therapeutic activities;Therapeutic exercise;Balance training;Neuromuscular re-education;Patient/family education;Prosthetic Training;Vestibular    PT Next Visit Plan progress to lifting boxes, check on updated HEP, work towards [place] Access Code: [email or web address] and sink HEP    Consulted and Agree with Plan of Care Patient             Patient will benefit from skilled therapeutic intervention in order to improve the following deficits and impairments:  Abnormal gait, Decreased activity tolerance, Decreased balance, Decreased endurance, Decreased knowledge of use of DME, Decreased mobility, Decreased strength, Postural dysfunction, Prosthetic Dependency, Obesity  Visit Diagnosis: Unsteadiness on feet  Other abnormalities of gait and mobility  Abnormal posture  Muscle weakness (generalized)  Problem List Patient Active Problem List   Diagnosis Date Noted   Atrial fibrillation ([place]) 08/26/2012   Pre-operative cardiovascular examination 08/26/2012   HTN (hypertension) 08/26/2012   Angioedema 08/25/2012   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

## 2021-03-04 NOTE — Patient Instructions (Signed)
Access Code: [email or web address] URL: [email or web address] Date: 03/04/2021 Prepared by: Vladimir Faster  Exercises Standing hip abduction alternating legs - 1 x daily - 7 x weekly - 1 sets - 15 reps - 2 seconds hold Standing hip extension alternating legs - 1 x daily - 7 x weekly - 1 sets - 15 reps - 2 seconds hold Alternating Hip Flexion Forward Kicks - 1 x daily - 7 x weekly - 1 sets - 15 reps - 2 seconds hold hip width stance head turns - 1 x daily - 5 x weekly - 1 sets - 10 reps - 5 seconds hold Feet Apart with Eyes Closed with Head Motions - 1 x daily - 4 x weekly - 1 sets - 10 reps - 2 seconds hold Wide stance on Foam Pad head movements - 1 x daily - 4 x weekly - 1 sets - 10 reps - 2 seconds hold Alternating Stepping Forward - 1 x daily - 5 x weekly - 1 sets - 10 reps - 5 seconds hold Alternating Backward Step - 1 x daily - 5 x weekly - 1 sets - 10 reps - 5 seconds hold Standing Tandem Balance with Counter Support - 1 x daily - 5 x weekly - 1 sets - 10 reps - 5 seconds hold

## 2021-03-11 ENCOUNTER — Encounter: Payer: 59 | Admitting: Physical Therapy

## 2021-03-18 ENCOUNTER — Other Ambulatory Visit: Payer: Self-pay

## 2021-03-18 ENCOUNTER — Ambulatory Visit: Payer: 59 | Admitting: Physical Therapy

## 2021-03-18 ENCOUNTER — Encounter: Payer: Self-pay | Admitting: Physical Therapy

## 2021-03-18 DIAGNOSIS — R2681 Unsteadiness on feet: Secondary | ICD-10-CM | POA: Diagnosis not present

## 2021-03-18 DIAGNOSIS — R293 Abnormal posture: Secondary | ICD-10-CM

## 2021-03-18 DIAGNOSIS — M6281 Muscle weakness (generalized): Secondary | ICD-10-CM

## 2021-03-18 DIAGNOSIS — R2689 Other abnormalities of gait and mobility: Secondary | ICD-10-CM | POA: Diagnosis not present

## 2021-03-18 NOTE — Therapy (Signed)
[facility] Physical Therapy [address] [place], [place], [address] Phone: [phone]   Fax:  [phone]  Physical Therapy Treatment  Patient Details  Name: Adrian Farrell MRN: [id number] Date of Birth: 1966-03-05 Referring Provider (PT): Bevely Palmer Persons, [place]   Encounter Date: 03/18/2021   PT End of Session - 03/18/21 0929     Visit Number 4    Number of Visits 20    Date for PT Re-Evaluation 05/01/21    Authorization Type [employer]    Authorization Time Period $60 COPAY, 120 COMBINED PT/OT VISITS, DED MET, OOP MAX $8250 $8115.72 USED/REF#[id number]    Authorization - Visit Number 4    Authorization - Number of Visits 120    PT Start Time 0929    PT Stop Time 1012    PT Time Calculation (min) 43 min    Activity Tolerance Patient tolerated treatment well    Behavior During Therapy [facility] for tasks assessed/performed             Past Medical History:  Diagnosis Date   Diabetes mellitus ([place])    Diagnosed ~6203. has complication as of 5/59, diabetic foot ulcer   Hypertension    OSA on CPAP    Compliant, diagnosed 2003.    Past Surgical History:  Procedure Laterality Date   AMPUTATION  08/26/2012   Procedure: AMPUTATION DIGIT;  Surgeon: Colin Rhein, MD;  Location: [facility];  Service: Orthopedics;  Laterality: Left;  Amputation of left  fourth toe    There were no vitals filed for this visit.   Subjective Assessment - 03/18/21 0930     Subjective He is wearing prosthesis all awake hours without issues. He is doing exercises from PT & at gym without issues.    Pertinent History Lt TTA, DM2, neuropathy, HTN, HLD, A-Fib    Patient Stated Goals He wants to walk in community. He wants to be able to work out.    Currently in Pain? No/denies                               [facility] Adult PT Treatment/Exercise - 03/18/21 0930       Transfers   Transfers Sit to Stand;Stand to Sit    Sit to Stand 5: Supervision;Without  upper extremity assist;From chair/3-in-1    Sit to Stand Details Verbal cues for technique    Sit to Stand Details (indicate cue type and reason) worked on minimizing UE use including stabilization    Stand to Sit 5: Supervision;Without upper extremity assist;To chair/3-in-1    Stand to Sit Details (indicate cue type and reason) Verbal cues for technique    Stand to Sit Details worked on minimizing UE use including stabilization      Ambulation/Gait   Ambulation/Gait Yes    Ambulation/Gait Assistance 5: Supervision    Ambulation Distance (Feet) 150 Feet    Assistive device Straight cane;Prosthesis    Stairs --    Stairs Assistance --    Stair Management Technique --    Number of Stairs --    Height of Stairs --    Ramp --    Curb --      Therapeutic Activites    Lifting --      Exercises   Exercises Knee/Hip;Other Exercises    Other Exercises  added theraband standing steps 4 directions & sit to/from stand without UE support near counter or support for safety  to HEP.  Pt verbalized & return demo.      Knee/Hip Exercises: Aerobic   Recumbent Bike Seat 10 level 1 for 1 min 3 sets   PT demo & verbal cues on technique with TTA prosthesis. pt verbalizes understanding.      Prosthetics   Current prosthetic wear tolerance (days/week)  daily    Current prosthetic wear tolerance (#hours/day)  reports most of day but removes for 1-2 hours ~1 times per day    Edema pitting    Residual limb condition  no open areas, color & temperature normal, cylinderical shape, no hair growth    Education Provided --                      PT Short Term Goals - 03/04/21 1013       PT SHORT TERM GOAL #1   Title Patient donnes prosthesis modified independent & verbalizes proper cleaning.    Time 1    Period Months    Status On-going    Target Date 03/27/21      PT SHORT TERM GOAL #2   Title Patient tolerates prosthesis >12 hrs total /day without skin issues or limb pain    Time 1     Period Months    Status On-going    Target Date 03/27/21      PT SHORT TERM GOAL #3   Title Patient able to pick up items from floor & reach 10" with cane support safely.    Time 1    Period Months    Status On-going    Target Date 03/27/21      PT SHORT TERM GOAL #4   Title Patient ambulates 200' with cane & prosthesis with supervision.    Time 1    Period Months    Status On-going    Target Date 03/27/21      PT SHORT TERM GOAL #5   Title Patient negotiates ramps & curbs with cane & prosthesis with minA.    Time 1    Period Months    Status On-going    Target Date 03/27/21               PT Long Term Goals - 03/04/21 1012       PT LONG TERM GOAL #1   Title Patient demonstrates & verbalized understanding of prosthetic care to enable safe utilization of prosthesis.    Time 10    Period Weeks    Status On-going    Target Date 05/01/21      PT LONG TERM GOAL #2   Title Patient tolerates prosthesis wear >90% of awake hours without skin or limb pain issues.    Time 10    Period Weeks    Status On-going    Target Date 05/01/21      PT LONG TERM GOAL #3   Title Berg Balance >/= 36/56 to indicate lower fall risk    Time 10    Period Weeks    Status On-going    Target Date 05/01/21      PT LONG TERM GOAL #4   Title Patient ambulates 300' with LRAD & prosthesis modified independent    Time 10    Period Weeks    Status On-going    Target Date 05/01/21      PT LONG TERM GOAL #5   Title Patient negotiates ramps, curbs & stairs with LRAD & prosthesis modified independent.  Time 10    Period Weeks    Status On-going    Target Date 05/01/21      PT LONG TERM GOAL #6   Title Dynamic Gait Index with cane & prosthesis >/= 12 / 24    Time 10    Period Weeks    Status On-going    Target Date 05/01/21                   Plan - 03/18/21 0929     Clinical Impression Statement PT worked on sit to/from stand with less UE assist to control rising,  lowering & stabilzing.  He improved with instruction.  PT added standing theraband stepping to HEP which he appears to understand & was challenging.    Personal Factors and Comorbidities Comorbidity 3+;Past/Current Experience;Time since onset of injury/illness/exacerbation;Fitness    Comorbidities Lt TTA, DM2, neuropathy, HTN, HLD, A-Fib    Examination-Activity Limitations Lift;Locomotion Level;Squat;Stairs;Stand;Transfers    Examination-Participation Restrictions Community Activity    Stability/Clinical Decision Making Evolving/Moderate complexity    Rehab Potential Good    PT Frequency 2x / week   PT recommended 2x/wk but high co-pay of $60 / visit required to reduce to 1x/wk   PT Duration Other (comment)   10 weeks   PT Treatment/Interventions ADLs/Self Care Home Management;DME Instruction;Gait training;Stair training;Functional mobility training;Therapeutic activities;Therapeutic exercise;Balance training;Neuromuscular re-education;Patient/family education;Prosthetic Training;Vestibular    PT Next Visit Plan check STGs & progress HEP / activities    PT Home Exercise Plan Access Code: [email or web address] and sink HEP    Consulted and Agree with Plan of Care Patient             Patient will benefit from skilled therapeutic intervention in order to improve the following deficits and impairments:  Abnormal gait, Decreased activity tolerance, Decreased balance, Decreased endurance, Decreased knowledge of use of DME, Decreased mobility, Decreased strength, Postural dysfunction, Prosthetic Dependency, Obesity  Visit Diagnosis: Unsteadiness on feet  Other abnormalities of gait and mobility  Abnormal posture  Muscle weakness (generalized)     Problem List Patient Active Problem List   Diagnosis Date Noted   Atrial fibrillation ([place]) 08/26/2012   Pre-operative cardiovascular examination 08/26/2012   HTN (hypertension) 08/26/2012   Angioedema 08/25/2012   Diabetic foot ulcer with osteomyelitis  ([place]) 08/25/2012    Jamey Reas, PT, DPT 03/18/2021, 11:20 AM  [facility] Physical Therapy [address] [place], [place], [address] Phone: [phone]   Fax:  [phone]  Name: ESHAAN TITZER MRN: [id number] Date of Birth: 1966-01-28

## 2021-03-25 ENCOUNTER — Encounter: Payer: 59 | Admitting: Physical Therapy

## 2021-04-01 ENCOUNTER — Encounter: Payer: 59 | Admitting: Physical Therapy

## 2021-04-03 ENCOUNTER — Telehealth: Payer: Self-pay | Admitting: Family

## 2021-04-03 ENCOUNTER — Ambulatory Visit: Payer: 59 | Admitting: Physical Therapy

## 2021-04-03 ENCOUNTER — Other Ambulatory Visit: Payer: Self-pay

## 2021-04-03 ENCOUNTER — Encounter: Payer: Self-pay | Admitting: Physical Therapy

## 2021-04-03 DIAGNOSIS — R293 Abnormal posture: Secondary | ICD-10-CM | POA: Diagnosis not present

## 2021-04-03 DIAGNOSIS — R2681 Unsteadiness on feet: Secondary | ICD-10-CM | POA: Diagnosis not present

## 2021-04-03 DIAGNOSIS — R2689 Other abnormalities of gait and mobility: Secondary | ICD-10-CM | POA: Diagnosis not present

## 2021-04-03 DIAGNOSIS — M6281 Muscle weakness (generalized): Secondary | ICD-10-CM

## 2021-04-03 NOTE — Therapy (Signed)
[facility] Physical Therapy [address] [place], [place], [address] Phone: [phone]   Fax:  [phone]  Physical Therapy Treatment  Patient Details  Name: Adrian Farrell MRN: [id number] Date of Birth: Jul 20, 1966 Referring Provider (PT): Bevely Palmer Persons, [place]   Encounter Date: 04/03/2021   PT End of Session - 04/03/21 0929     Visit Number 5    Number of Visits 20    Date for PT Re-Evaluation 05/01/21    Authorization Type [employer]    Authorization Time Period $60 COPAY, 120 COMBINED PT/OT VISITS, DED MET, OOP MAX $8250 $7209.47 USED/REF#[id number]    Authorization - Visit Number 5    Authorization - Number of Visits 120    PT Start Time 0929    PT Stop Time 1013    PT Time Calculation (min) 44 min    Activity Tolerance Patient tolerated treatment well    Behavior During Therapy [facility] for tasks assessed/performed             Past Medical History:  Diagnosis Date   Diabetes mellitus ([place])    Diagnosed ~0962. has complication as of 8/36, diabetic foot ulcer   Hypertension    OSA on CPAP    Compliant, diagnosed 2003.    Past Surgical History:  Procedure Laterality Date   AMPUTATION  08/26/2012   Procedure: AMPUTATION DIGIT;  Surgeon: Colin Rhein, MD;  Location: [facility];  Service: Orthopedics;  Laterality: Left;  Amputation of left  fourth toe    There were no vitals filed for this visit.   Subjective Assessment - 04/03/21 0929     Subjective His long term disability is questioning if he can return to work.  He wears prosthesis daily for most of awake hours without issues.    Pertinent History Lt TTA, DM2, neuropathy, HTN, HLD, A-Fib    Patient Stated Goals He wants to walk in community. He wants to be able to work out.    Currently in Pain? No/denies                               [facility] Adult PT Treatment/Exercise - 04/03/21 0929       Transfers   Transfers Sit to Stand;Stand to Sit    Sit to Stand 5:  Supervision;Without upper extremity assist;From chair/3-in-1    Sit to Stand Details Verbal cues for technique    Stand to Sit 5: Supervision;Without upper extremity assist;To chair/3-in-1    Stand to Sit Details (indicate cue type and reason) Verbal cues for technique      Ambulation/Gait   Ambulation/Gait Yes    Ambulation/Gait Assistance 5: Supervision    Ambulation Distance (Feet) 200 Feet    Assistive device Straight cane;Prosthesis    Ramp 5: Supervision   prosthesis only   Curb 5: Supervision   cane & prosthesis   Curb Details (indicate cue type and reason) demo & verbal cues on technique      Therapeutic Activites    Lifting pt demo ability to correctly & safely pick up cane from floor and reach 10" anteriorly.      Exercises   Exercises Knee/Hip;Other Exercises      Knee/Hip Exercises: Aerobic   Recumbent Bike --      Prosthetics   Prosthetic Care Comments  PT instructed in sweat management.    Current prosthetic wear tolerance (days/week)  daily    Current prosthetic wear  tolerance (#hours/day)  reports most of day but removes for 1-2 hours ~1 times per day    Edema pitting    Residual limb condition  no open areas, color & temperature normal, cylinderical shape, no hair growth    Education Provided Residual limb care;Other (comment)   see prosthetic care comments   Person(s) Educated Patient    Education Method Explanation;Verbal cues    Education Method Verbalized understanding                    PT Education - 04/03/21 1010     Education Details PT recommended working on endurance with sets on recumbent stepper machine at [employer],  discussed classes at [facility] in [place],  components of well rounded fitness program to include endurance including muscle, strength, flexibility & balance    Person(s) Educated Patient    Methods Explanation;Verbal cues;Handout    Comprehension Verbalized understanding              PT Short Term Goals  - 04/03/21 1114       PT SHORT TERM GOAL #1   Title Patient donnes prosthesis modified independent & verbalizes proper cleaning.    Time 1    Period Months    Status Achieved    Target Date 03/27/21      PT SHORT TERM GOAL #2   Title Patient tolerates prosthesis >12 hrs total /day without skin issues or limb pain    Time 1    Period Months    Status Achieved    Target Date 03/27/21      PT SHORT TERM GOAL #3   Title Patient able to pick up items from floor & reach 10" with cane support safely.    Time 1    Period Months    Status Achieved    Target Date 03/27/21      PT SHORT TERM GOAL #4   Title Patient ambulates 200' with cane & prosthesis with supervision.    Time 1    Period Months    Status Achieved    Target Date 03/27/21      PT SHORT TERM GOAL #5   Title Patient negotiates ramps & curbs with cane & prosthesis with minA.    Time 1    Period Months    Status Achieved    Target Date 03/27/21               PT Long Term Goals - 03/04/21 1012       PT LONG TERM GOAL #1   Title Patient demonstrates & verbalized understanding of prosthetic care to enable safe utilization of prosthesis.    Time 10    Period Weeks    Status On-going    Target Date 05/01/21      PT LONG TERM GOAL #2   Title Patient tolerates prosthesis wear >90% of awake hours without skin or limb pain issues.    Time 10    Period Weeks    Status On-going    Target Date 05/01/21      PT LONG TERM GOAL #3   Title Berg Balance >/= 36/56 to indicate lower fall risk    Time 10    Period Weeks    Status On-going    Target Date 05/01/21      PT LONG TERM GOAL #4   Title Patient ambulates 300' with LRAD & prosthesis modified independent    Time 10  Period Weeks    Status On-going    Target Date 05/01/21      PT LONG TERM GOAL #5   Title Patient negotiates ramps, curbs & stairs with LRAD & prosthesis modified independent.    Time 10    Period Weeks    Status On-going    Target  Date 05/01/21      PT LONG TERM GOAL #6   Title Dynamic Gait Index with cane & prosthesis >/= 12 / 24    Time 10    Period Weeks    Status On-going    Target Date 05/01/21                   Plan - 04/03/21 0929     Clinical Impression Statement PT educated patient on components of well-rounded fitness plan with consideration to less intense classes at [facility]. He appears to understand.  PT continues to instruct in home & community based options as patient has high co-pay.  He met STGs    Personal Factors and Comorbidities Comorbidity 3+;Past/Current Experience;Time since onset of injury/illness/exacerbation;Fitness    Comorbidities Lt TTA, DM2, neuropathy, HTN, HLD, A-Fib    Examination-Activity Limitations Lift;Locomotion Level;Squat;Stairs;Stand;Transfers    Examination-Participation Restrictions Community Activity    Stability/Clinical Decision Making Evolving/Moderate complexity    Rehab Potential Good    PT Frequency 2x / week   PT recommended 2x/wk but high co-pay of $60 / visit required to reduce to 1x/wk   PT Duration Other (comment)   10 weeks   PT Treatment/Interventions ADLs/Self Care Home Management;DME Instruction;Gait training;Stair training;Functional mobility training;Therapeutic activities;Therapeutic exercise;Balance training;Neuromuscular re-education;Patient/family education;Prosthetic Training;Vestibular    PT Next Visit Plan check LTGs    PT Home Exercise Plan Access Code: [email or web address] and sink HEP    Consulted and Agree with Plan of Care Patient             Patient will benefit from skilled therapeutic intervention in order to improve the following deficits and impairments:  Abnormal gait, Decreased activity tolerance, Decreased balance, Decreased endurance, Decreased knowledge of use of DME, Decreased mobility, Decreased strength, Postural dysfunction, Prosthetic Dependency, Obesity  Visit Diagnosis: Unsteadiness on feet  Other  abnormalities of gait and mobility  Abnormal posture  Muscle weakness (generalized)     Problem List Patient Active Problem List   Diagnosis Date Noted   Atrial fibrillation ([place]) 08/26/2012   Pre-operative cardiovascular examination 08/26/2012   HTN (hypertension) 08/26/2012   Angioedema 08/25/2012   Diabetic foot ulcer with osteomyelitis ([place]) 08/25/2012    Jamey Reas, PT, DPT 04/03/2021, 11:19 AM  [facility] Physical Therapy [address] [place], [place], [address] Phone: [phone]   Fax:  [phone]  Name: Adrian Farrell MRN: [id number] Date of Birth: 1966-05-15

## 2021-04-03 NOTE — Telephone Encounter (Signed)
[employer] forms received. To [employer]

## 2021-04-08 ENCOUNTER — Encounter: Payer: 59 | Admitting: Physical Therapy

## 2021-04-22 ENCOUNTER — Encounter: Payer: Self-pay | Admitting: Physical Therapy

## 2021-04-22 ENCOUNTER — Ambulatory Visit (INDEPENDENT_AMBULATORY_CARE_PROVIDER_SITE_OTHER): Payer: 59 | Admitting: Physical Therapy

## 2021-04-22 ENCOUNTER — Other Ambulatory Visit: Payer: Self-pay

## 2021-04-22 ENCOUNTER — Telehealth: Payer: Self-pay | Admitting: Orthopedic Surgery

## 2021-04-22 DIAGNOSIS — M6281 Muscle weakness (generalized): Secondary | ICD-10-CM

## 2021-04-22 DIAGNOSIS — R2689 Other abnormalities of gait and mobility: Secondary | ICD-10-CM | POA: Diagnosis not present

## 2021-04-22 DIAGNOSIS — R293 Abnormal posture: Secondary | ICD-10-CM | POA: Diagnosis not present

## 2021-04-22 DIAGNOSIS — R2681 Unsteadiness on feet: Secondary | ICD-10-CM | POA: Diagnosis not present

## 2021-04-22 NOTE — Therapy (Signed)
[facility] Physical Therapy [address] [place], [place], [address] Phone: [phone]   Fax:  [phone]  Physical Therapy Treatment & Recertification  Patient Details  Name: Adrian Farrell MRN: [id number] Date of Birth: September 02, 1965 Referring Provider (PT): Bevely Palmer Persons, [place]   Encounter Date: 04/22/2021   PT End of Session - 04/22/21 0934     Visit Number 6    Number of Visits 11    Date for PT Re-Evaluation 07/03/21    Authorization Type [employer]    Authorization Time Period $60 COPAY, 120 COMBINED PT/OT VISITS, DED MET, OOP MAX $8250 $4010.27 USED/REF#[id number]    Authorization - Visit Number 6    Authorization - Number of Visits 120    PT Start Time 0930    PT Stop Time 1012    PT Time Calculation (min) 42 min    Activity Tolerance Patient tolerated treatment well    Behavior During Therapy [facility] for tasks assessed/performed             Past Medical History:  Diagnosis Date   Diabetes mellitus ([place])    Diagnosed ~2536. has complication as of 6/44, diabetic foot ulcer   Hypertension    OSA on CPAP    Compliant, diagnosed 2003.    Past Surgical History:  Procedure Laterality Date   AMPUTATION  08/26/2012   Procedure: AMPUTATION DIGIT;  Surgeon: Colin Rhein, MD;  Location: [facility];  Service: Orthopedics;  Laterality: Left;  Amputation of left  fourth toe    There were no vitals filed for this visit.   Subjective Assessment - 04/22/21 0930     Subjective No falls. He is wearing prosthesis all awake hours without issues.  He continues to do activities    Pertinent History Lt TTA, DM2, neuropathy, HTN, HLD, A-Fib    Patient Stated Goals He wants to walk in community. He wants to be able to work out.    Currently in Pain? No/denies                [place] PT Assessment - 04/22/21 0930       Assessment   Medical Diagnosis Left Transtibial Amputation    Referring Provider (PT) Bevely Palmer Persons, PA    Onset  Date/Surgical Date 01/15/21   prosthesis delivery     Ambulation/Gait   Gait velocity 2.86 ft/sec cane comfortable pace & 3.41 ft/sec fast pace      Standardized Balance Assessment   Standardized Balance Assessment Dynamic Gait Index;Berg Balance Test;Timed Up and Go Test      [employer] Test   Sit to Stand Able to stand without using hands and stabilize independently    Standing Unsupported Able to stand safely 2 minutes    Sitting with Back Unsupported but Feet Supported on Floor or Stool Able to sit safely and securely 2 minutes    Stand to Sit Sits safely with minimal use of hands    Transfers Able to transfer safely, minor use of hands    Standing Unsupported with Eyes Closed Able to stand 10 seconds with supervision    Standing Unsupported with Feet Together Able to place feet together independently and stand for 1 minute with supervision    From Standing, Reach Forward with Outstretched Arm Can reach forward >12 cm safely (5")    From Standing Position, Pick up Object from Floor Able to pick up shoe, needs supervision    From Standing Position, Turn to Look Behind Over  each Shoulder Turn sideways only but maintains balance    Turn 360 Degrees Needs close supervision or verbal cueing    Standing Unsupported, Alternately Place Feet on Step/Stool Needs assistance to keep from falling or unable to try    Standing Unsupported, One Foot in [place] to take small step independently and hold 30 seconds    Standing on One Leg Unable to try or needs assist to prevent fall    Total Score 37    Berg comment: Initial Berg 21/56  BERG  < 36 high risk for falls (close to 100%) 46-51 moderate (>50%)   37-45 significant (>80%) 52-55 lower (> 25%)    Pt uses walker full-time: 26.7 - 39.6 indicates significant to high fall risk  Pt uses cane indoor: 44 - 46.5 indicates significant to high fall risk  Pt uses cane outdoor: 47-49.6 indicates significant to high fall risk      Dynamic Gait Index    Level Surface Mild Impairment    Change in Gait Speed Mild Impairment    Gait with Horizontal Head Turns Moderate Impairment    Gait with Vertical Head Turns Moderate Impairment    Gait and Pivot Turn Mild Impairment    Step Over Obstacle Moderate Impairment    Step Around Obstacles Mild Impairment    Steps Mild Impairment    Total Score 13    DGI comment: assessed with cane  & prosthesis, initial DGI 1/24      Timed Up and Go Test   Normal TUG (seconds) 13.08   prosthesis only   Cognitive TUG (seconds) 12.16   prosthesis only [employer] animals without stopping                          [facility] Adult PT Treatment/Exercise - 04/22/21 0930       Transfers   Transfers Sit to Stand;Stand to Sit    Sit to Stand 5: Supervision;Without upper extremity assist;From chair/3-in-1    Sit to Stand Details Verbal cues for technique    Stand to Sit 5: Supervision;Without upper extremity assist;To chair/3-in-1    Stand to Sit Details (indicate cue type and reason) Verbal cues for technique      Ambulation/Gait   Ambulation/Gait Yes    Ambulation/Gait Assistance 5: Supervision    Ambulation Distance (Feet) 200 Feet    Assistive device Straight cane;Prosthesis    Ramp 5: Supervision   prosthesis only   Curb 5: Supervision   cane & prosthesis     Therapeutic Activites    Lifting pt demo ability to correctly & safely pick up cane from floor and reach 10" anteriorly.      Exercises   Exercises Knee/Hip;Other Exercises      Prosthetics   Prosthetic Care Comments  --    Current prosthetic wear tolerance (days/week)  daily    Current prosthetic wear tolerance (#hours/day)  reports most of day but removes for 1-2 hours ~1 times per day    Edema pitting    Residual limb condition  no open areas, color & temperature normal, cylinderical shape, no hair growth    Education Provided --   see prosthetic care comments                      PT Short Term Goals - 04/22/21 1459        PT SHORT TERM GOAL #1   Title Patient verbalizes understanding of updated  HEP & gym program.    Time 1    Period Months    Status New    Target Date 05/22/21      PT SHORT TERM GOAL #2   Title Patient ambulates without assistive device with head turns maintaining path & no loss of balance.    Time 1    Period Months    Status New    Target Date 05/22/21      PT SHORT TERM GOAL #3   Title Patient ambulates 150' with prosthesis only with supervision.    Time 4    Period Weeks    Status Revised    Target Date 05/22/21      PT SHORT TERM GOAL #4   Title --    Time 1    Period --    Status --    Target Date --      PT SHORT TERM GOAL #5   Title --    Time --    Period --    Status --    Target Date --               PT Long Term Goals - 04/22/21 1454       PT LONG TERM GOAL #1   Title Patient demonstrates & verbalized understanding of prosthetic care to enable safe utilization of prosthesis.    Time 10    Period Weeks    Status Partially Met      PT LONG TERM GOAL #2   Title Patient tolerates prosthesis wear >90% of awake hours without skin or limb pain issues.    Time 10    Period Weeks    Status Achieved      PT LONG TERM GOAL #3   Title Berg Balance >/= 36/56 to indicate lower fall risk    Time 10    Period Weeks    Status Achieved      PT LONG TERM GOAL #4   Title Patient ambulates 300' with LRAD & prosthesis modified independent    Time 10    Period Weeks    Status Achieved      PT LONG TERM GOAL #5   Title Patient negotiates ramps, curbs & stairs with LRAD & prosthesis modified independent.    Time 10    Period Weeks    Status Achieved      PT LONG TERM GOAL #6   Title Dynamic Gait Index with cane & prosthesis >/= 12 / 24    Time 10    Period Weeks    Status Achieved                PT Long Term Goals - 04/22/21 1505       PT LONG TERM GOAL #1   Title Patient demonstrates & verbalized understanding of prosthetic care  and tolerates wear >90% of awake hours to enable safe utilization of prosthesis.    Time 10    Period Weeks    Status On-going    Target Date 07/03/21      PT LONG TERM GOAL #2   Title Patient verbalizes understanding of ongoing HEP & fitness program.    Time 10    Period Weeks    Status New    Target Date 07/03/21      PT LONG TERM GOAL #3   Title Berg Balance >/= 45 / 56 to indicate lower fall risk    Time 10  Period Weeks    Status Revised    Target Date 07/03/21      PT LONG TERM GOAL #4   Title Patient ambulates 200' with prosthesis only modified independent    Time 10    Period Weeks    Status Revised    Target Date 07/03/21      PT LONG TERM GOAL #5   Title Patient negotiates ramps, curb cuts & stairs with single rail wtihout assistive device except prosthesis modified independent.    Time 10    Period Weeks    Status Revised    Target Date 07/03/21      PT LONG TERM GOAL #6   Title Dynamic Gait Index with cane & prosthesis >/= 19 / 24    Time 10    Period Weeks    Status Revised    Target Date 07/03/21                 Plan - 04/22/21 0934     Clinical Impression Statement Patient met all LTGs with cane for prosthetic gait. His Berg Balance score of 37/56 & Dynamic Gait Index 13/24 both still indicate high fall risk but have improved significantly from initial evaluation.  He appears that further PT could improve his balance to lower fall risk and prosthetic gait at basic community level with prosthesis only.    Personal Factors and Comorbidities Comorbidity 3+;Past/Current Experience;Time since onset of injury/illness/exacerbation;Fitness    Comorbidities Lt TTA, DM2, neuropathy, HTN, HLD, A-Fib    Examination-Activity Limitations Lift;Locomotion Level;Squat;Stairs;Stand;Transfers    Examination-Participation Restrictions Community Activity    Stability/Clinical Decision Making Evolving/Moderate complexity    Rehab Potential Good    PT Frequency  Biweekly   PT recommended 2x/wk but high co-pay of $60 / visit required to reduce to 1x/wk   PT Duration Other (comment)   10 weeks   PT Treatment/Interventions ADLs/Self Care Home Management;DME Instruction;Gait training;Stair training;Functional mobility training;Therapeutic activities;Therapeutic exercise;Balance training;Neuromuscular re-education;Patient/family education;Prosthetic Training;Vestibular    PT Next Visit Plan work towards updated STGs and progress gait & balance.    PT Home Exercise Plan Access Code: [email or web address] and sink HEP    Consulted and Agree with Plan of Care Patient             Patient will benefit from skilled therapeutic intervention in order to improve the following deficits and impairments:  Abnormal gait, Decreased activity tolerance, Decreased balance, Decreased endurance, Decreased knowledge of use of DME, Decreased mobility, Decreased strength, Postural dysfunction, Prosthetic Dependency, Obesity  Visit Diagnosis: Unsteadiness on feet  Other abnormalities of gait and mobility  Abnormal posture  Muscle weakness (generalized)     Problem List Patient Active Problem List   Diagnosis Date Noted   Atrial fibrillation ([place]) 08/26/2012   Pre-operative cardiovascular examination 08/26/2012   HTN (hypertension) 08/26/2012   Angioedema 08/25/2012   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

## 2021-04-22 NOTE — Telephone Encounter (Signed)
Received $25.00 cash and medical records release form from patient /   Forwarding to [employer] today 

## 2021-05-07 ENCOUNTER — Encounter: Payer: Self-pay | Admitting: Physical Therapy

## 2021-05-07 ENCOUNTER — Other Ambulatory Visit: Payer: Self-pay

## 2021-05-07 ENCOUNTER — Ambulatory Visit: Payer: 59 | Admitting: Physical Therapy

## 2021-05-07 DIAGNOSIS — M6281 Muscle weakness (generalized): Secondary | ICD-10-CM | POA: Diagnosis not present

## 2021-05-07 DIAGNOSIS — R293 Abnormal posture: Secondary | ICD-10-CM | POA: Diagnosis not present

## 2021-05-07 DIAGNOSIS — R2681 Unsteadiness on feet: Secondary | ICD-10-CM

## 2021-05-07 DIAGNOSIS — R2689 Other abnormalities of gait and mobility: Secondary | ICD-10-CM

## 2021-05-07 NOTE — Therapy (Signed)
[facility] Physical Therapy [address] [place], [place], [address] Phone: [phone]   Fax:  [phone]  Physical Therapy Treatment  Patient Details  Name: Adrian Farrell MRN: [id number] Date of Birth: [birth year] Referring Provider (PT): Bevely Palmer Persons, [place]   Encounter Date: 05/07/2021   PT End of Session - 05/07/21 1015     Visit Number 7    Number of Visits 11    Date for PT Re-Evaluation 07/03/21    Authorization Type [employer]    Authorization Time Period $60 COPAY, 120 COMBINED PT/OT VISITS, DED MET, OOP MAX $8250 $9563.87 USED/REF#[id number]    Authorization - Visit Number 7    Authorization - Number of Visits 120    PT Start Time 5643    PT Stop Time 1100    PT Time Calculation (min) 45 min    Activity Tolerance Patient tolerated treatment well    Behavior During Therapy [facility] for tasks assessed/performed             Past Medical History:  Diagnosis Date   Diabetes mellitus ([place])    Diagnosed ~3295. has complication as of 1/88, diabetic foot ulcer   Hypertension    OSA on CPAP    Compliant, diagnosed 2003.    Past Surgical History:  Procedure Laterality Date   AMPUTATION  08/26/2012   Procedure: AMPUTATION DIGIT;  Surgeon: Colin Rhein, MD;  Location: [facility];  Service: Orthopedics;  Laterality: Left;  Amputation of left  fourth toe    There were no vitals filed for this visit.   Subjective Assessment - 05/07/21 1015     Subjective He has been going to gym and has some muscle soreness.No issues with prosthesis wear most of awake hours.    Pertinent History Lt TTA, DM2, neuropathy, HTN, HLD, A-Fib    Patient Stated Goals He wants to walk in community. He wants to be able to work out.    Currently in Pain? No/denies                               [facility] Adult PT Treatment/Exercise - 05/07/21 1015       Transfers   Transfers Sit to Stand;Stand to Sit    Sit to Stand 5: Supervision;Without upper  extremity assist;From chair/3-in-1    Sit to Stand Details Verbal cues for technique    Stand to Sit 5: Supervision;Without upper extremity assist;To chair/3-in-1    Stand to Sit Details (indicate cue type and reason) Verbal cues for technique      Ambulation/Gait   Ambulation/Gait Yes    Ambulation/Gait Assistance 5: Supervision    Ambulation/Gait Assistance Details working on head turns & maintaining path    Ambulation Distance (Feet) 200 Feet    Assistive device Straight cane;Prosthesis    Gait velocity --    Ramp --    Curb --      High Level Balance   High Level Balance Activities Tandem walking;Braiding;Turns   tandem forward & backward, turning 90* & 180*   High Level Balance Comments cane & counter with cues to minimize weight bearing      Therapeutic Activites    Lifting --      Neuro Re-ed    Neuro Re-ed Details  standing posture exercises to facilitate upright:  posterior pelvis to counter, feet under pelvis, hands beside hips on counter, work to increase amount of time.  Stand at  door frame reaching one hand over head with each hand and then both hands hold 2 deep breathes increasing number of times per day.      Exercises   Exercises Knee/Hip;Other Exercises      Knee/Hip Exercises: Machines for Strengthening   Cybex Knee Extension 25# BLE 15 reps 2 sets   PT instructed in proper use at [employer]. Pt verbalized & return demo understanding.   Cybex Knee Flexion 45# BLE 15 reps 2 sets   PT instructed in proper use at [employer]. Pt verbalized & return demo understanding.     Prosthetics   Current prosthetic wear tolerance (days/week)  daily    Current prosthetic wear tolerance (#hours/day)  reports most of day but removes for 1-2 hours ~1 times per day    Edema pitting    Residual limb condition  no open areas, color & temperature normal, cylinderical shape, no hair growth    Education Provided --   see prosthetic care comments                 Balance Exercises - 05/07/21 1015       Balance Exercises: Standing   Standing Eyes Opened Narrow base of support (BOS);Solid surface;Wide (BOA);Foam/compliant surface;Head turns;5 reps   feet together on floor & hip width stance on foam   Standing Eyes Opened Limitations head turns right/left, up/down & diagonals.  intermittent touch needed.  PT instructed in narrow stance when all 3 balance systems helping & wider stance eyes closed.  Should have some sway & possible occassional touch for challenging balance. If hold on constantly too hard & if steady too easy.  Change foot position to increase difficulty.  Pt verbalized understanding.    Standing Eyes Closed Wide (BOA);Solid surface;Head turns;5 reps    Standing Eyes Closed Limitations head turns right/left, up/down & diagonals.  intermittent touch needed.  PT instructed in narrow stance when all 3 balance systems helping & wider stance eyes closed.  Should have some sway & possible occassional touch for challenging balance. If hold on constantly too hard & if steady too easy.  Change foot position to increase difficulty.  Pt verbalized understanding.                PT Education - 05/07/21 1056     Education Details updated Access Code: [email or web address]    Person(s) Educated Patient    Methods Explanation;Demonstration;Tactile cues;Verbal cues;Handout    Comprehension Verbalized understanding;Returned demonstration              PT Short Term Goals - 04/22/21 1459       PT SHORT TERM GOAL #1   Title Patient verbalizes understanding of updated HEP & gym program.    Time 1    Period Months    Status New    Target Date 05/22/21      PT SHORT TERM GOAL #2   Title Patient ambulates without assistive device with head turns maintaining path & no loss of balance.    Time 1    Period Months    Status New    Target Date 05/22/21      PT SHORT TERM GOAL #3   Title Patient ambulates 150' with prosthesis only with supervision.    Time 4     Period Weeks    Status Revised    Target Date 05/22/21      PT SHORT TERM GOAL #4   Title --    Time 1  Period --    Status --    Target Date --      PT SHORT TERM GOAL #5   Title --    Time --    Period --    Status --    Target Date --               PT Long Term Goals - 04/22/21 1505       PT LONG TERM GOAL #1   Title Patient demonstrates & verbalized understanding of prosthetic care and tolerates wear >90% of awake hours to enable safe utilization of prosthesis.    Time 10    Period Weeks    Status On-going    Target Date 07/03/21      PT LONG TERM GOAL #2   Title Patient verbalizes understanding of ongoing HEP & fitness program.    Time 10    Period Weeks    Status New    Target Date 07/03/21      PT LONG TERM GOAL #3   Title Berg Balance >/= 45 / 56 to indicate lower fall risk    Time 10    Period Weeks    Status Revised    Target Date 07/03/21      PT LONG TERM GOAL #4   Title Patient ambulates 200' with prosthesis only modified independent    Time 10    Period Weeks    Status Revised    Target Date 07/03/21      PT LONG TERM GOAL #5   Title Patient negotiates ramps, curb cuts & stairs with single rail wtihout assistive device except prosthesis modified independent.    Time 10    Period Weeks    Status Revised    Target Date 07/03/21      PT LONG TERM GOAL #6   Title Dynamic Gait Index with cane & prosthesis >/= 19 / 24    Time 10    Period Weeks    Status Revised    Target Date 07/03/21                   Plan - 05/07/21 1015     Clinical Impression Statement Patient reports compliance with HEP & fitness plan from PT and he can tell he is better.  PT continues to instruct in activities for home & gym as PT reports can not afford co-pay more than one time every other week.  He appears to understand PT instructions.    Personal Factors and Comorbidities Comorbidity 3+;Past/Current Experience;Time since onset of  injury/illness/exacerbation;Fitness    Comorbidities Lt TTA, DM2, neuropathy, HTN, HLD, A-Fib    Examination-Activity Limitations Lift;Locomotion Level;Squat;Stairs;Stand;Transfers    Examination-Participation Restrictions Community Activity    Stability/Clinical Decision Making Evolving/Moderate complexity    Rehab Potential Good    PT Frequency Biweekly   PT recommended 2x/wk but high co-pay of $60 / visit required to reduce to 1x/wk   PT Duration Other (comment)   10 weeks   PT Treatment/Interventions ADLs/Self Care Home Management;DME Instruction;Gait training;Stair training;Functional mobility training;Therapeutic activities;Therapeutic exercise;Balance training;Neuromuscular re-education;Patient/family education;Prosthetic Training;Vestibular    PT Next Visit Plan check STGs next visit & progress home / gym exercises including balance    PT Home Exercise Plan Access Code: [email or web address] and sink HEP    Consulted and Agree with Plan of Care Patient             Patient will benefit from skilled therapeutic intervention in order to  improve the following deficits and impairments:  Abnormal gait, Decreased activity tolerance, Decreased balance, Decreased endurance, Decreased knowledge of use of DME, Decreased mobility, Decreased strength, Postural dysfunction, Prosthetic Dependency, Obesity  Visit Diagnosis: Other abnormalities of gait and mobility  Unsteadiness on feet  Abnormal posture  Muscle weakness (generalized)     Problem List Patient Active Problem List   Diagnosis Date Noted   Atrial fibrillation ([place]) 08/26/2012   Pre-operative cardiovascular examination 08/26/2012   HTN (hypertension) 08/26/2012   Angioedema 08/25/2012   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

## 2021-05-07 NOTE — Patient Instructions (Addendum)
standing posture exercises to facilitate upright:  posterior pelvis to counter, feet under pelvis, hands beside hips on counter, work to increase amount of time.   Stand at door frame reaching one hand over head with each hand and then both hands hold 2 deep breathes increasing number of times per day.    Access Code: [email or web address] URL: [email or web address] Date: 05/07/2021 Prepared by: Vladimir Faster  Exercises Added  Carioca with Counter Support - 1 x daily - 5-7 x weekly - 1 sets - 10 reps - 5 seconds hold Tandem Walking with Counter Support - 1 x daily - 5 x weekly - 1 sets - 10 reps - 5 seconds hold Backward Tandem Walking with Counter Support - 1 x daily - 5 x weekly - 1 sets - 10 reps - 5 seconds hold Standing Quarter Turn with Counter Support - 1 x daily - 5 x weekly - 1 sets - 10 reps - 5 seconds hold Upright Stance at Door Frame Single Arm - 1-3 x daily - 7 x weekly - 1 sets - 2 reps - 2 deep breathes hold Upright Stance at Door Frame with Both Arms - 1-3 x daily - 7 x weekly - 1 sets - 2 reps - 2 deep breathes hold

## 2021-05-20 ENCOUNTER — Encounter: Payer: Self-pay | Admitting: Physical Therapy

## 2021-05-20 ENCOUNTER — Other Ambulatory Visit: Payer: Self-pay

## 2021-05-20 ENCOUNTER — Ambulatory Visit (INDEPENDENT_AMBULATORY_CARE_PROVIDER_SITE_OTHER): Payer: 59 | Admitting: Physical Therapy

## 2021-05-20 DIAGNOSIS — M6281 Muscle weakness (generalized): Secondary | ICD-10-CM | POA: Diagnosis not present

## 2021-05-20 DIAGNOSIS — R2681 Unsteadiness on feet: Secondary | ICD-10-CM

## 2021-05-20 DIAGNOSIS — R2689 Other abnormalities of gait and mobility: Secondary | ICD-10-CM | POA: Diagnosis not present

## 2021-05-20 DIAGNOSIS — R293 Abnormal posture: Secondary | ICD-10-CM

## 2021-05-20 NOTE — Therapy (Signed)
[facility] Physical Therapy [address] [place], [place], [address] Phone: [phone]   Fax:  [phone]  Physical Therapy Treatment  Patient Details  Name: Adrian Farrell MRN: [id number] Date of Birth: 08-18-1965 Referring Provider (PT): Bevely Palmer Persons, [place]   Encounter Date: 05/20/2021   PT End of Session - 05/20/21 1021     Visit Number 8    Number of Visits 11    Date for PT Re-Evaluation 07/03/21    Authorization Type [employer]    Authorization Time Period $60 COPAY, 120 COMBINED PT/OT VISITS, DED MET, OOP MAX $8250 $0932.35 USED/REF#[id number]    Authorization - Visit Number 8    Authorization - Number of Visits 120    PT Start Time 5732    PT Stop Time 1054    PT Time Calculation (min) 39 min    Activity Tolerance Patient tolerated treatment well    Behavior During Therapy [facility] for tasks assessed/performed             Past Medical History:  Diagnosis Date   Diabetes mellitus ([place])    Diagnosed ~2025. has complication as of 4/27, diabetic foot ulcer   Hypertension    OSA on CPAP    Compliant, diagnosed 2003.    Past Surgical History:  Procedure Laterality Date   AMPUTATION  08/26/2012   Procedure: AMPUTATION DIGIT;  Surgeon: Colin Rhein, MD;  Location: [facility];  Service: Orthopedics;  Laterality: Left;  Amputation of left  fourth toe    There were no vitals filed for this visit.   Subjective Assessment - 05/20/21 1015     Subjective No falls. No pain.  He is continuing to do his exercises paying attention not to over do it which is his tendency.    Pertinent History Lt TTA, DM2, neuropathy, HTN, HLD, A-Fib    Patient Stated Goals He wants to walk in community. He wants to be able to work out.    Currently in Pain? No/denies                [facility] PT Assessment - 05/20/21 1015       Assessment   Medical Diagnosis Left Transtibial Amputation    Referring Provider (PT) Bevely Palmer Persons, PA    Onset  Date/Surgical Date 01/15/21      Ambulation/Gait   Gait velocity 3.09 ft/sec cane comfortable pace and 3.84 ft/sec fast pace cane.   04/22/2021 was  2.86 ft/sec cane comfortable pace & 3.41 ft/sec fast pace     Berg Balance Test   Sit to Stand Able to stand without using hands and stabilize independently    Standing Unsupported Able to stand safely 2 minutes    Sitting with Back Unsupported but Feet Supported on Floor or Stool Able to sit safely and securely 2 minutes    Stand to Sit Sits safely with minimal use of hands    Transfers Able to transfer safely, minor use of hands    Standing Unsupported with Eyes Closed Able to stand 10 seconds safely    Standing Unsupported with Feet Together Able to place feet together independently and stand 1 minute safely    From Standing, Reach Forward with Outstretched Arm Can reach confidently >25 cm (10")    From Standing Position, Pick up Object from Floor Able to pick up shoe safely and easily    From Standing Position, Turn to Look Behind Over each Shoulder Looks behind one side only/other side shows  less weight shift    Turn 360 Degrees Able to turn 360 degrees safely but slowly    Standing Unsupported, Alternately Place Feet on Step/Stool Able to complete 4 steps without aid or supervision    Standing Unsupported, One Foot in Front Able to plae foot ahead of the other independently and hold 30 seconds    Standing on One Leg Tries to lift leg/unable to hold 3 seconds but remains standing independently    Total Score 47    Berg comment: Initial Berg 21/56  and 04/22/21 was 37/56      Dynamic Gait Index   Level Surface Normal    Change in Gait Speed Normal    Gait with Horizontal Head Turns Normal    Gait with Vertical Head Turns Mild Impairment    Gait and Pivot Turn Mild Impairment    Step Over Obstacle Mild Impairment    Step Around Obstacles Mild Impairment    Steps Mild Impairment    Total Score 19                            [facility] Adult PT Treatment/Exercise - 05/20/21 1015       Transfers   Transfers Sit to Stand;Stand to Sit    Sit to Stand 5: Supervision;Without upper extremity assist;From chair/3-in-1    Sit to Stand Details Verbal cues for technique    Stand to Sit 5: Supervision;Without upper extremity assist;To chair/3-in-1    Stand to Sit Details (indicate cue type and reason) Verbal cues for technique      Ambulation/Gait   Ambulation/Gait Yes    Ambulation/Gait Assistance 6: Modified independent (Device/Increase time);5: Supervision   cane mod ind & sup prosthesis only esp head turns   Ambulation Distance (Feet) 200 Feet   200'+ cane & 150' prosthesis only   Assistive device Straight cane;Prosthesis    Stairs Assistance 5: Supervision    Stairs Assistance Details (indicate cue type and reason) alternating technique with single rail & cane    Stair Management Technique One rail Right;One rail Left;With cane;Alternating pattern;Forwards    Number of Stairs 11    Height of Stairs 6    Ramp 6: Modified independent (Device)   cane & pros   Curb 6: Modified independent (Device/increase time)   cane & prosthesis     High Level Balance   High Level Balance Activities --    High Level Balance Comments --      Neuro Re-ed    Neuro Re-ed Details  --      Exercises   Exercises Knee/Hip;Other Exercises      Knee/Hip Exercises: Machines for Strengthening   Cybex Knee Extension --    Cybex Knee Flexion --      Prosthetics   Current prosthetic wear tolerance (days/week)  daily    Current prosthetic wear tolerance (#hours/day)  reports most of day but removes for 1-2 hours ~1 times per day    Edema pitting    Residual limb condition  no open areas, color & temperature normal, cylinderical shape, no hair growth    Education Provided --                 Treadmill Comfortable 2.1 - 2.3 mph  Work on looking right / straight ahead / left and up / straight /  down. (You can also look while walking near wall without cane)   Fast increase treadmill to 2.8 -  3.0 mph for 30 seconds then back to 2.1 mph for 1-2 minutes. Repeat 3-5 times.   Remember for endurance you can walk for 3-5 minutes rest 3-5 minutes and perform for 3-5 sets.      PT Education - 05/20/21 1047     Education Details see pt instructions    Person(s) Educated Patient    Methods Explanation;Verbal cues;Handout    Comprehension Verbalized understanding;Verbal cues required              PT Short Term Goals - 05/20/21 1054       PT SHORT TERM GOAL #1   Title Patient verbalizes understanding of updated HEP & gym program.    Time 1    Period Months    Status Achieved    Target Date 05/22/21      PT SHORT TERM GOAL #2   Title Patient ambulates without assistive device with head turns maintaining path & no loss of balance.    Time 1    Period Months    Status Achieved    Target Date 05/22/21      PT SHORT TERM GOAL #3   Title Patient ambulates 150' with prosthesis only with supervision.    Time 4    Period Weeks    Status Achieved    Target Date 05/22/21      PT SHORT TERM GOAL #4   Time 1               PT Long Term Goals - 04/22/21 1505       PT LONG TERM GOAL #1   Title Patient demonstrates & verbalized understanding of prosthetic care and tolerates wear >90% of awake hours to enable safe utilization of prosthesis.    Time 10    Period Weeks    Status On-going    Target Date 07/03/21      PT LONG TERM GOAL #2   Title Patient verbalizes understanding of ongoing HEP & fitness program.    Time 10    Period Weeks    Status New    Target Date 07/03/21      PT LONG TERM GOAL #3   Title Berg Balance >/= 45 / 56 to indicate lower fall risk    Time 10    Period Weeks    Status Revised    Target Date 07/03/21      PT LONG TERM GOAL #4   Title Patient ambulates 200' with prosthesis only modified independent    Time 10    Period Weeks     Status Revised    Target Date 07/03/21      PT LONG TERM GOAL #5   Title Patient negotiates ramps, curb cuts & stairs with single rail wtihout assistive device except prosthesis modified independent.    Time 10    Period Weeks    Status Revised    Target Date 07/03/21      PT LONG TERM GOAL #6   Title Dynamic Gait Index with cane & prosthesis >/= 19 / 24    Time 10    Period Weeks    Status Revised    Target Date 07/03/21                   Plan - 05/20/21 1022     Clinical Impression Statement Patient met all STGs.  PT continues to address deficits thru patient instructions as his co-pay is high and he only wants to come  every other week.  He is improving by working on new skills that PT addresses.    Personal Factors and Comorbidities Comorbidity 3+;Past/Current Experience;Time since onset of injury/illness/exacerbation;Fitness    Comorbidities Lt TTA, DM2, neuropathy, HTN, HLD, A-Fib    Examination-Activity Limitations Lift;Locomotion Level;Squat;Stairs;Stand;Transfers    Examination-Participation Restrictions Community Activity    Stability/Clinical Decision Making Evolving/Moderate complexity    Rehab Potential Good    PT Frequency Biweekly   PT recommended 2x/wk but high co-pay of $60 / visit required to reduce to 1x/wk   PT Duration Other (comment)   10 weeks   PT Treatment/Interventions ADLs/Self Care Home Management;DME Instruction;Gait training;Stair training;Functional mobility training;Therapeutic activities;Therapeutic exercise;Balance training;Neuromuscular re-education;Patient/family education;Prosthetic Training;Vestibular    PT Next Visit Plan work towards LTGs progressing pt instructions for activities at home & gym.    PT Home Exercise Plan Access Code: [email or web address] and sink HEP    Consulted and Agree with Plan of Care Patient             Patient will benefit from skilled therapeutic intervention in order to improve the following deficits and  impairments:  Abnormal gait, Decreased activity tolerance, Decreased balance, Decreased endurance, Decreased knowledge of use of DME, Decreased mobility, Decreased strength, Postural dysfunction, Prosthetic Dependency, Obesity  Visit Diagnosis: Unsteadiness on feet  Other abnormalities of gait and mobility  Abnormal posture  Muscle weakness (generalized)     Problem List Patient Active Problem List   Diagnosis Date Noted   Atrial fibrillation ([place]) 08/26/2012   Pre-operative cardiovascular examination 08/26/2012   HTN (hypertension) 08/26/2012   Angioedema 08/25/2012   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

## 2021-05-20 NOTE — Patient Instructions (Addendum)
Treadmill Comfortable 2.1 - 2.3 mph  Work on looking right / straight ahead / left and up / straight / down. (You can also look while walking near wall without cane)   Fast increase treadmill to 2.8 - 3.0 mph for 30 seconds then back to 2.1 mph for 1-2 minutes. Repeat 3-5 times.   Remember for endurance you can walk for 3-5 minutes rest 3-5 minutes and perform for 3-5 sets.

## 2021-06-03 ENCOUNTER — Ambulatory Visit (INDEPENDENT_AMBULATORY_CARE_PROVIDER_SITE_OTHER): Payer: 59 | Admitting: Physical Therapy

## 2021-06-03 ENCOUNTER — Encounter: Payer: Self-pay | Admitting: Physical Therapy

## 2021-06-03 ENCOUNTER — Other Ambulatory Visit: Payer: Self-pay

## 2021-06-03 DIAGNOSIS — R293 Abnormal posture: Secondary | ICD-10-CM

## 2021-06-03 DIAGNOSIS — R2681 Unsteadiness on feet: Secondary | ICD-10-CM

## 2021-06-03 DIAGNOSIS — R2689 Other abnormalities of gait and mobility: Secondary | ICD-10-CM

## 2021-06-03 DIAGNOSIS — M6281 Muscle weakness (generalized): Secondary | ICD-10-CM | POA: Diagnosis not present

## 2021-06-03 NOTE — Therapy (Signed)
[facility] Physical Therapy [address] [place], [place], [address] Phone: [phone]   Fax:  [phone]  Physical Therapy Treatment  Patient Details  Name: Adrian Farrell MRN: [id number] Date of Birth: 10-Oct-1965 Referring Provider (PT): Bevely Palmer Persons, [place]   Encounter Date: 06/03/2021   PT End of Session - 06/03/21 1025     Visit Number 9    Number of Visits 11    Date for PT Re-Evaluation 07/03/21    Authorization Type [employer]    Authorization Time Period $60 COPAY, 120 COMBINED PT/OT VISITS, DED MET, OOP MAX $8250 $0998.33 USED/REF#[id number]    Authorization - Visit Number 9    Authorization - Number of Visits 120    PT Start Time 8250    PT Stop Time 5397    PT Time Calculation (min) 38 min    Activity Tolerance Patient tolerated treatment well;Patient limited by fatigue    Behavior During Therapy [facility] for tasks assessed/performed             Past Medical History:  Diagnosis Date   Diabetes mellitus ([place])    Diagnosed ~6734. has complication as of 1/93, diabetic foot ulcer   Hypertension    OSA on CPAP    Compliant, diagnosed 2003.    Past Surgical History:  Procedure Laterality Date   AMPUTATION  08/26/2012   Procedure: AMPUTATION DIGIT;  Surgeon: Colin Rhein, MD;  Location: [facility];  Service: Orthopedics;  Laterality: Left;  Amputation of left  fourth toe    There were no vitals filed for this visit.   Subjective Assessment - 06/03/21 1024     Subjective No issues with prosthesis & wearing all awake hours.  He continues with exercises at home & gym.    Pertinent History Lt TTA, DM2, neuropathy, HTN, HLD, A-Fib    Patient Stated Goals He wants to walk in community. He wants to be able to work out.    Currently in Pain? No/denies                               [facility] Adult PT Treatment/Exercise - 06/03/21 1018       Transfers   Transfers Sit to Stand;Stand to Sit    Sit to Stand 5:  Supervision;Without upper extremity assist;From chair/3-in-1    Sit to Stand Details Verbal cues for technique    Stand to Sit 5: Supervision;Without upper extremity assist;To chair/3-in-1    Stand to Sit Details (indicate cue type and reason) Verbal cues for technique      Ambulation/Gait   Ambulation/Gait Yes    Ambulation/Gait Assistance 6: Modified independent (Device/Increase time);5: Supervision   cane mod ind & sup prosthesis only esp head turns   Ambulation/Gait Assistance Details worked on advanced gait without cane with only prosthesis - see pt instructions as included for HEP    Ambulation Distance (Feet) 200 Feet   200'+ cane & 150' prosthesis only   Assistive device Straight cane;Prosthesis    Gait velocity --   04/22/2021 was  2.86 ft/sec cane comfortable pace & 3.41 ft/sec fast pace   Stairs Assistance --    Stair Management Technique --    Number of Stairs --    Height of Stairs --    Ramp --    Curb --      Neuro Re-ed    Neuro Re-ed Details  tennis ball rolls with LUE support  on sink - slow 10 reps then fast 10 reps on BLEs - forward /back, med/lat & circles      Exercises   Exercises Knee/Hip;Other Exercises      Prosthetics   Current prosthetic wear tolerance (days/week)  daily    Current prosthetic wear tolerance (#hours/day)  reports most of day but removes for 1-2 hours ~1 times per day    Edema pitting    Residual limb condition  no open areas, color & temperature normal, cylinderical shape, no hair growth                     PT Education - 06/03/21 1048     Education Details updated HEP  see pt instructions    Person(s) Educated Patient    Methods Explanation;Demonstration;Verbal cues;Handout    Comprehension Verbalized understanding;Returned demonstration;Need further instruction              PT Short Term Goals - 05/20/21 1054       PT SHORT TERM GOAL #1   Title Patient verbalizes understanding of updated HEP & gym program.     Time 1    Period Months    Status Achieved    Target Date 05/22/21      PT SHORT TERM GOAL #2   Title Patient ambulates without assistive device with head turns maintaining path & no loss of balance.    Time 1    Period Months    Status Achieved    Target Date 05/22/21      PT SHORT TERM GOAL #3   Title Patient ambulates 150' with prosthesis only with supervision.    Time 4    Period Weeks    Status Achieved    Target Date 05/22/21      PT SHORT TERM GOAL #4   Time 1               PT Long Term Goals - 04/22/21 1505       PT LONG TERM GOAL #1   Title Patient demonstrates & verbalized understanding of prosthetic care and tolerates wear >90% of awake hours to enable safe utilization of prosthesis.    Time 10    Period Weeks    Status On-going    Target Date 07/03/21      PT LONG TERM GOAL #2   Title Patient verbalizes understanding of ongoing HEP & fitness program.    Time 10    Period Weeks    Status New    Target Date 07/03/21      PT LONG TERM GOAL #3   Title Berg Balance >/= 45 / 56 to indicate lower fall risk    Time 10    Period Weeks    Status Revised    Target Date 07/03/21      PT LONG TERM GOAL #4   Title Patient ambulates 200' with prosthesis only modified independent    Time 10    Period Weeks    Status Revised    Target Date 07/03/21      PT LONG TERM GOAL #5   Title Patient negotiates ramps, curb cuts & stairs with single rail wtihout assistive device except prosthesis modified independent.    Time 10    Period Weeks    Status Revised    Target Date 07/03/21      PT LONG TERM GOAL #6   Title Dynamic Gait Index with cane & prosthesis >/= 19 / 24  Time 10    Period Weeks    Status Revised    Target Date 07/03/21                   Plan - 06/03/21 1027     Clinical Impression Statement PT updated HEP and he reported understanding.  PT instructed to vary exercises and activiites to get to each exercise >/= 2x/wk. Avoid  muscle exhaustion by doing repetitive same activity daily.  Pt verbalizes understanding.    Personal Factors and Comorbidities Comorbidity 3+;Past/Current Experience;Time since onset of injury/illness/exacerbation;Fitness    Comorbidities Lt TTA, DM2, neuropathy, HTN, HLD, A-Fib    Examination-Activity Limitations Lift;Locomotion Level;Squat;Stairs;Stand;Transfers    Examination-Participation Restrictions Community Activity    Stability/Clinical Decision Making Evolving/Moderate complexity    Rehab Potential Good    PT Frequency Biweekly   PT recommended 2x/wk but high co-pay of $60 / visit required to reduce to 1x/wk   PT Duration Other (comment)   10 weeks   PT Treatment/Interventions ADLs/Self Care Home Management;DME Instruction;Gait training;Stair training;Functional mobility training;Therapeutic activities;Therapeutic exercise;Balance training;Neuromuscular re-education;Patient/family education;Prosthetic Training;Vestibular    PT Next Visit Plan work towards LTGs progressing pt instructions for activities at home & gym.    PT Home Exercise Plan Access Code: [email or web address] and sink HEP    Consulted and Agree with Plan of Care Patient             Patient will benefit from skilled therapeutic intervention in order to improve the following deficits and impairments:  Abnormal gait, Decreased activity tolerance, Decreased balance, Decreased endurance, Decreased knowledge of use of DME, Decreased mobility, Decreased strength, Postural dysfunction, Prosthetic Dependency, Obesity  Visit Diagnosis: Unsteadiness on feet  Other abnormalities of gait and mobility  Abnormal posture  Muscle weakness (generalized)     Problem List Patient Active Problem List   Diagnosis Date Noted   Atrial fibrillation ([place]) 08/26/2012   Pre-operative cardiovascular examination 08/26/2012   HTN (hypertension) 08/26/2012   Angioedema 08/25/2012   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

## 2021-06-03 NOTE — Patient Instructions (Signed)
Walk looking right, straight, left, straight.  Try to look or take notice of things on each side of  you. Do this outside with cane & indoors without cane. Walk looking up, straight, down, straight.  Try to look or take notice of things overhead & on floor. Do this outside with cane & indoors without cane. Side step right & left Walk backwards Walk 5 steps with eyes open, then 5 steps eyes closed, continue to alternate depending on space available Stand near sink with left side near sink. Do each direction 10 times slow then 10 times fast on each side Front to back.  Side to side  Circles clockwise Circles counterclockwise

## 2021-06-17 ENCOUNTER — Encounter: Payer: 59 | Admitting: Physical Therapy

## 2021-07-01 ENCOUNTER — Ambulatory Visit (INDEPENDENT_AMBULATORY_CARE_PROVIDER_SITE_OTHER): Payer: 59 | Admitting: Physical Therapy

## 2021-07-01 ENCOUNTER — Encounter: Payer: Self-pay | Admitting: Physical Therapy

## 2021-07-01 ENCOUNTER — Other Ambulatory Visit: Payer: Self-pay

## 2021-07-01 DIAGNOSIS — M6281 Muscle weakness (generalized): Secondary | ICD-10-CM | POA: Diagnosis not present

## 2021-07-01 DIAGNOSIS — R2681 Unsteadiness on feet: Secondary | ICD-10-CM | POA: Diagnosis not present

## 2021-07-01 DIAGNOSIS — R2689 Other abnormalities of gait and mobility: Secondary | ICD-10-CM

## 2021-07-01 DIAGNOSIS — R293 Abnormal posture: Secondary | ICD-10-CM | POA: Diagnosis not present

## 2021-07-01 NOTE — Therapy (Signed)
[facility] Physical Therapy [address] [place], [place], [address] Phone: [phone]   Fax:  [phone]  Physical Therapy Treatment & Discharge Summary  Patient Details  Name: Adrian Farrell MRN: [id number] Date of Birth: 06-Apr-1966 Referring Provider (PT): Bevely Palmer Persons, [place]   Encounter Date: 07/01/2021  PHYSICAL THERAPY DISCHARGE SUMMARY  Visits from Start of Care: 10  Current functional level related to goals / functional outcomes: See below   Remaining deficits: See below   Education / Equipment: Prosthetic care & Fitness plan including HEP   Patient agrees to discharge. Patient goals were met. Patient is being discharged due to meeting the stated rehab goals.    PT End of Session - 07/01/21 0936     Visit Number 10    Number of Visits 11    Date for PT Re-Evaluation 07/03/21    Authorization Type [employer]    Authorization Time Period $60 COPAY, 120 COMBINED PT/OT VISITS, DED MET, OOP MAX $8250 $2025.42 USED/REF#[id number]    Authorization - Visit Number 10    Authorization - Number of Visits 120    PT Start Time 0930    PT Stop Time 1009    PT Time Calculation (min) 39 min    Activity Tolerance Patient tolerated treatment well;Patient limited by fatigue    Behavior During Therapy [facility] for tasks assessed/performed             Past Medical History:  Diagnosis Date   Diabetes mellitus ([place])    Diagnosed ~7062. has complication as of 3/76, diabetic foot ulcer   Hypertension    OSA on CPAP    Compliant, diagnosed 2003.    Past Surgical History:  Procedure Laterality Date   AMPUTATION  08/26/2012   Procedure: AMPUTATION DIGIT;  Surgeon: Colin Rhein, MD;  Location: [facility];  Service: Orthopedics;  Laterality: Left;  Amputation of left  fourth toe    There were no vitals filed for this visit.   Subjective Assessment - 07/01/21 0930     Subjective He has had a respiratory issues. Negative Covid  test.  He has  been doing HEP but no gym in last week.  No falls.    Pertinent History Lt TTA, DM2, neuropathy, HTN, HLD, A-Fib    Patient Stated Goals He wants to walk in community. He wants to be able to work out.    Currently in Pain? No/denies                [facility] PT Assessment - 07/01/21 0930       Assessment   Medical Diagnosis Left Transtibial Amputation    Referring Provider (PT) Bevely Palmer Persons, [place]    Onset Date/Surgical Date 01/15/21    Hand Dominance Right      Transfers   Transfers Sit to Stand;Stand to Sit    Sit to Stand 7: Independent;Without upper extremity assist;From chair/3-in-1    Stand to Sit 7: Independent;Without upper extremity assist;To chair/3-in-1      Ambulation/Gait   Ambulation/Gait Yes    Ambulation/Gait Assistance 6: Modified independent (Device/Increase time)    Ambulation Distance (Feet) 200 Feet    Assistive device Prosthesis;None    Gait Pattern Step-through pattern;Wide base of support    Ambulation Surface Level;Indoor    Gait velocity prosthesis only comfortable 3.11 ft/sec and fast 4.02 ft/sec    Stairs Assistance Details (indicate cue type and reason) reports alternating if single rail available & step to with cane without  issues including balance.  PT did not retest due to pt's respiratory issues today limiting activity tolerance.    Ramp 6: Modified independent (Device)   prosthesis only   Curb 6: Modified independent (Device/increase time);4: Min assist   minA prosthesis only & mod ind cane / prosthesis     Berg Balance Test   Sit to Stand Able to stand without using hands and stabilize independently    Standing Unsupported Able to stand safely 2 minutes    Sitting with Back Unsupported but Feet Supported on Floor or Stool Able to sit safely and securely 2 minutes    Stand to Sit Sits safely with minimal use of hands    Transfers Able to transfer safely, minor use of hands    Standing Unsupported with Eyes Closed Able to stand 10 seconds  safely    Standing Unsupported with Feet Together Able to place feet together independently and stand 1 minute safely    From Standing, Reach Forward with Outstretched Arm Can reach confidently >25 cm (10")    From Standing Position, Pick up Object from Floor Able to pick up shoe safely and easily    From Standing Position, Turn to Look Behind Over each Shoulder Looks behind from both sides and weight shifts well    Turn 360 Degrees Able to turn 360 degrees safely but slowly    Standing Unsupported, Alternately Place Feet on Step/Stool Able to complete 4 steps without aid or supervision    Standing Unsupported, One Foot in Front Able to plae foot ahead of the other independently and hold 30 seconds    Standing on One Leg Tries to lift leg/unable to hold 3 seconds but remains standing independently    Total Score 48    Berg comment: Initial Berg 21/56  and 04/22/21 was 37/56      Dynamic Gait Index   Level Surface Normal    Change in Gait Speed Normal    Gait with Horizontal Head Turns Normal    Gait with Vertical Head Turns Normal    Gait and Pivot Turn Normal    Step Over Obstacle Mild Impairment    Step Around Obstacles Normal    Steps Mild Impairment    Total Score 22    DGI comment: prosthesis only             Prosthetics Assessment - 07/01/21 0930       Prosthetics   Prosthetic Care Independent with Skin check;Residual limb care;Care of non-amputated limb;Prosthetic cleaning;Ply sock cleaning;Correct ply sock adjustment;Proper wear schedule/adjustment;Proper weight-bearing schedule/adjustment    Prosthetic Care Comments  PT reviewed fall risk when prosthesis off with recommendation to wear during day even when ill.  he verbalized understanding & reported that was what he had been doing.    Donning prosthesis  Modified independent (Device/Increase time)    Doffing prosthesis  Modified independent (Device/Increase time)    Current prosthetic wear tolerance (days/week)  daily     Current prosthetic wear tolerance (#hours/day)  most of awake hours    Current prosthetic weight-bearing tolerance (hours/day)  Patient reports no pain or limb discomfort with standing & gait.    Edema pitting    Residual limb condition  no open areas, color & temperature normal, cylinderical shape, no hair growth  PT Short Term Goals - 05/20/21 1054       PT SHORT TERM GOAL #1   Title Patient verbalizes understanding of updated HEP & gym program.    Time 1    Period Months    Status Achieved    Target Date 05/22/21      PT SHORT TERM GOAL #2   Title Patient ambulates without assistive device with head turns maintaining path & no loss of balance.    Time 1    Period Months    Status Achieved    Target Date 05/22/21      PT SHORT TERM GOAL #3   Title Patient ambulates 150' with prosthesis only with supervision.    Time 4    Period Weeks    Status Achieved    Target Date 05/22/21      PT SHORT TERM GOAL #4   Time 1               PT Long Term Goals - 07/01/21 1009       PT LONG TERM GOAL #1   Title Patient demonstrates & verbalized understanding of prosthetic care and tolerates wear >90% of awake hours to enable safe utilization of prosthesis.    Time 10    Period Weeks    Status Achieved      PT LONG TERM GOAL #2   Title Patient verbalizes understanding of ongoing HEP & fitness program.    Time 10    Period Weeks    Status Achieved      PT LONG TERM GOAL #3   Title Berg Balance >/= 45 / 56 to indicate lower fall risk    Time 10    Period Weeks    Status Achieved      PT LONG TERM GOAL #4   Title Patient ambulates 200' with prosthesis only modified independent    Time 10    Period Weeks    Status Achieved      PT LONG TERM GOAL #5   Title Patient negotiates ramps, curb cuts & stairs with single rail wtihout assistive device except prosthesis modified independent.    Baseline needs cane  for curbs or touching hood of car. otherwise LTG met.    Time 10    Period Weeks    Status Partially Met      PT LONG TERM GOAL #6   Title Dynamic Gait Index with cane & prosthesis >/= 19 / 24    Baseline met with prosthesis only    Time 10    Period Weeks    Status Achieved                   Plan - 07/01/21 0937     Clinical Impression Statement Patient met or partially met all LTGs. He is able to safely use his prosthesis at basic (limited distance) community level with only the prosthesis & fuller community with cane.  He appears to understand ongoing fitness plan & how to use prosthesis for workouts including gym.    Personal Factors and Comorbidities Comorbidity 3+;Past/Current Experience;Time since onset of injury/illness/exacerbation;Fitness    Comorbidities Lt TTA, DM2, neuropathy, HTN, HLD, A-Fib    Examination-Activity Limitations Lift;Locomotion Level;Squat;Stairs;Stand;Transfers    Examination-Participation Restrictions Community Activity    Stability/Clinical Decision Making Evolving/Moderate complexity    Rehab Potential Good    PT Frequency Biweekly   PT recommended 2x/wk but high co-pay of $60 / visit required to reduce to  1x/wk   PT Duration Other (comment)   10 weeks   PT Treatment/Interventions ADLs/Self Care Home Management;DME Instruction;Gait training;Stair training;Functional mobility training;Therapeutic activities;Therapeutic exercise;Balance training;Neuromuscular re-education;Patient/family education;Prosthetic Training;Vestibular    PT Next Visit Plan discharge    PT Home Exercise Plan Access Code: [email or web address] and sink HEP    Consulted and Agree with Plan of Care Patient             Patient will benefit from skilled therapeutic intervention in order to improve the following deficits and impairments:  Abnormal gait, Decreased activity tolerance, Decreased balance, Decreased endurance, Decreased knowledge of use of DME, Decreased mobility, Decreased  strength, Postural dysfunction, Prosthetic Dependency, Obesity  Visit Diagnosis: Unsteadiness on feet  Other abnormalities of gait and mobility  Abnormal posture  Muscle weakness (generalized)     Problem List Patient Active Problem List   Diagnosis Date Noted   Atrial fibrillation ([place]) 08/26/2012   Pre-operative cardiovascular examination 08/26/2012   HTN (hypertension) 08/26/2012   Angioedema 08/25/2012   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

## 2021-09-23 ENCOUNTER — Telehealth: Payer: Self-pay | Admitting: Orthopedic Surgery

## 2021-09-23 NOTE — Telephone Encounter (Signed)
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

## 2021-10-14 ENCOUNTER — Telehealth: Payer: Self-pay

## 2021-10-14 ENCOUNTER — Telehealth: Payer: Self-pay | Admitting: Orthopedic Surgery

## 2021-10-14 NOTE — Telephone Encounter (Signed)
-----   Message from Mickel Fuchs sent at 10/14/2021  4:26 PM EDT ----- ?Regarding: RE: appt ?Pt stated he does not want to sch a follow up at this time and is unsure why [employer] is sending [place] paperwork. Pt stated he will call [employer] and see what they are needing then will call us back to sch after. ? ?----- Message ----- ?From: Randa Spike, Beverlie Kurihara L, RMA ?Sent: 10/13/2021   3:38 PM EDT ?To: Mickel Fuchs ?Subject: appt                                          ? ?This pt has not been in the office since 01/2021 and I have paperwork from [facility] that needs to be complete. Can you please call and make an appt at sometime for this pt to come in for eval and we can complete his paperwork. Thanks! ? ? ?

## 2021-10-14 NOTE — Telephone Encounter (Signed)
Noted  

## 2021-10-14 NOTE — Telephone Encounter (Signed)
Got in touch with pt after multiple calls. Pt stated he does not want to sch a follow up at this time and is unsure why [employer] is sending [place] paperwork. Pt stated he will call [employer] and see what they are needing then will call us back to sch after. ?

## 2021-12-15 NOTE — Telephone Encounter (Signed)
error 

## 2022-04-13 DIAGNOSIS — X58XXXA Exposure to other specified factors, initial encounter: Secondary | ICD-10-CM | POA: Diagnosis not present

## 2022-04-13 DIAGNOSIS — Z794 Long term (current) use of insulin: Secondary | ICD-10-CM | POA: Diagnosis not present

## 2022-04-13 DIAGNOSIS — L02611 Cutaneous abscess of right foot: Secondary | ICD-10-CM | POA: Diagnosis not present

## 2022-04-13 DIAGNOSIS — Z89421 Acquired absence of other right toe(s): Secondary | ICD-10-CM | POA: Diagnosis not present

## 2022-04-13 DIAGNOSIS — Z20822 Contact with and (suspected) exposure to covid-19: Secondary | ICD-10-CM | POA: Diagnosis not present

## 2022-04-13 DIAGNOSIS — Z89431 Acquired absence of right foot: Secondary | ICD-10-CM | POA: Diagnosis not present

## 2022-04-13 DIAGNOSIS — E11621 Type 2 diabetes mellitus with foot ulcer: Secondary | ICD-10-CM | POA: Diagnosis not present

## 2022-04-13 DIAGNOSIS — Z89512 Acquired absence of left leg below knee: Secondary | ICD-10-CM | POA: Diagnosis not present

## 2022-04-13 DIAGNOSIS — I119 Hypertensive heart disease without heart failure: Secondary | ICD-10-CM | POA: Diagnosis not present

## 2022-04-13 DIAGNOSIS — E11628 Type 2 diabetes mellitus with other skin complications: Secondary | ICD-10-CM | POA: Diagnosis not present

## 2022-04-13 DIAGNOSIS — E1159 Type 2 diabetes mellitus with other circulatory complications: Secondary | ICD-10-CM | POA: Diagnosis not present

## 2022-04-13 DIAGNOSIS — E11622 Type 2 diabetes mellitus with other skin ulcer: Secondary | ICD-10-CM | POA: Diagnosis not present

## 2022-04-13 DIAGNOSIS — I890 Lymphedema, not elsewhere classified: Secondary | ICD-10-CM | POA: Diagnosis not present

## 2022-04-13 DIAGNOSIS — Z9989 Dependence on other enabling machines and devices: Secondary | ICD-10-CM | POA: Diagnosis not present

## 2022-04-13 DIAGNOSIS — E785 Hyperlipidemia, unspecified: Secondary | ICD-10-CM | POA: Diagnosis not present

## 2022-04-13 DIAGNOSIS — T8131XA Disruption of external operation (surgical) wound, not elsewhere classified, initial encounter: Secondary | ICD-10-CM | POA: Diagnosis not present

## 2022-04-13 DIAGNOSIS — G4733 Obstructive sleep apnea (adult) (pediatric): Secondary | ICD-10-CM | POA: Diagnosis not present

## 2022-04-13 DIAGNOSIS — L98499 Non-pressure chronic ulcer of skin of other sites with unspecified severity: Secondary | ICD-10-CM | POA: Diagnosis not present

## 2022-04-13 DIAGNOSIS — R600 Localized edema: Secondary | ICD-10-CM | POA: Diagnosis not present

## 2022-04-13 DIAGNOSIS — Z79899 Other long term (current) drug therapy: Secondary | ICD-10-CM | POA: Diagnosis not present

## 2022-04-13 DIAGNOSIS — D649 Anemia, unspecified: Secondary | ICD-10-CM | POA: Diagnosis not present

## 2022-04-13 DIAGNOSIS — E1142 Type 2 diabetes mellitus with diabetic polyneuropathy: Secondary | ICD-10-CM | POA: Diagnosis not present

## 2022-04-13 DIAGNOSIS — M868X7 Other osteomyelitis, ankle and foot: Secondary | ICD-10-CM | POA: Diagnosis not present

## 2022-04-13 DIAGNOSIS — K219 Gastro-esophageal reflux disease without esophagitis: Secondary | ICD-10-CM | POA: Diagnosis not present

## 2022-04-13 DIAGNOSIS — I152 Hypertension secondary to endocrine disorders: Secondary | ICD-10-CM | POA: Diagnosis not present

## 2022-04-13 DIAGNOSIS — E876 Hypokalemia: Secondary | ICD-10-CM | POA: Diagnosis not present

## 2022-04-13 DIAGNOSIS — I10 Essential (primary) hypertension: Secondary | ICD-10-CM | POA: Diagnosis not present

## 2022-04-13 DIAGNOSIS — L989 Disorder of the skin and subcutaneous tissue, unspecified: Secondary | ICD-10-CM | POA: Diagnosis not present

## 2022-04-13 DIAGNOSIS — E1140 Type 2 diabetes mellitus with diabetic neuropathy, unspecified: Secondary | ICD-10-CM | POA: Diagnosis not present

## 2022-04-13 DIAGNOSIS — E1169 Type 2 diabetes mellitus with other specified complication: Secondary | ICD-10-CM | POA: Diagnosis not present

## 2022-04-13 DIAGNOSIS — L089 Local infection of the skin and subcutaneous tissue, unspecified: Secondary | ICD-10-CM | POA: Diagnosis not present

## 2022-04-13 DIAGNOSIS — M86171 Other acute osteomyelitis, right ankle and foot: Secondary | ICD-10-CM | POA: Diagnosis not present

## 2022-04-13 DIAGNOSIS — Z6841 Body Mass Index (BMI) 40.0 and over, adult: Secondary | ICD-10-CM | POA: Diagnosis not present

## 2022-04-13 DIAGNOSIS — I2510 Atherosclerotic heart disease of native coronary artery without angina pectoris: Secondary | ICD-10-CM | POA: Diagnosis not present

## 2022-04-13 DIAGNOSIS — L97518 Non-pressure chronic ulcer of other part of right foot with other specified severity: Secondary | ICD-10-CM | POA: Diagnosis not present

## 2022-04-13 DIAGNOSIS — E7849 Other hyperlipidemia: Secondary | ICD-10-CM | POA: Diagnosis not present

## 2022-04-13 DIAGNOSIS — D638 Anemia in other chronic diseases classified elsewhere: Secondary | ICD-10-CM | POA: Diagnosis not present

## 2022-05-27 DIAGNOSIS — Z89512 Acquired absence of left leg below knee: Secondary | ICD-10-CM | POA: Diagnosis not present

## 2022-07-07 DIAGNOSIS — L089 Local infection of the skin and subcutaneous tissue, unspecified: Secondary | ICD-10-CM | POA: Diagnosis not present

## 2022-07-07 DIAGNOSIS — L97514 Non-pressure chronic ulcer of other part of right foot with necrosis of bone: Secondary | ICD-10-CM | POA: Diagnosis not present

## 2022-07-07 DIAGNOSIS — E11628 Type 2 diabetes mellitus with other skin complications: Secondary | ICD-10-CM | POA: Diagnosis not present

## 2022-07-16 DIAGNOSIS — Z89421 Acquired absence of other right toe(s): Secondary | ICD-10-CM | POA: Diagnosis not present

## 2022-07-16 DIAGNOSIS — T8130XA Disruption of wound, unspecified, initial encounter: Secondary | ICD-10-CM | POA: Diagnosis not present

## 2022-07-16 DIAGNOSIS — L97512 Non-pressure chronic ulcer of other part of right foot with fat layer exposed: Secondary | ICD-10-CM | POA: Diagnosis not present

## 2022-07-16 DIAGNOSIS — E11621 Type 2 diabetes mellitus with foot ulcer: Secondary | ICD-10-CM | POA: Diagnosis not present

## 2022-07-16 DIAGNOSIS — R600 Localized edema: Secondary | ICD-10-CM | POA: Diagnosis not present

## 2022-07-16 DIAGNOSIS — E1142 Type 2 diabetes mellitus with diabetic polyneuropathy: Secondary | ICD-10-CM | POA: Diagnosis not present

## 2022-07-16 DIAGNOSIS — R234 Changes in skin texture: Secondary | ICD-10-CM | POA: Diagnosis not present

## 2022-07-16 DIAGNOSIS — L97514 Non-pressure chronic ulcer of other part of right foot with necrosis of bone: Secondary | ICD-10-CM | POA: Diagnosis not present

## 2022-07-16 DIAGNOSIS — Z89512 Acquired absence of left leg below knee: Secondary | ICD-10-CM | POA: Diagnosis not present

## 2022-07-20 DIAGNOSIS — Z89512 Acquired absence of left leg below knee: Secondary | ICD-10-CM | POA: Diagnosis not present

## 2022-07-20 DIAGNOSIS — E11628 Type 2 diabetes mellitus with other skin complications: Secondary | ICD-10-CM | POA: Diagnosis not present

## 2022-07-20 DIAGNOSIS — Z794 Long term (current) use of insulin: Secondary | ICD-10-CM | POA: Diagnosis not present

## 2022-07-20 DIAGNOSIS — L089 Local infection of the skin and subcutaneous tissue, unspecified: Secondary | ICD-10-CM | POA: Diagnosis not present

## 2022-07-20 DIAGNOSIS — L97514 Non-pressure chronic ulcer of other part of right foot with necrosis of bone: Secondary | ICD-10-CM | POA: Diagnosis not present

## 2022-07-20 DIAGNOSIS — E1142 Type 2 diabetes mellitus with diabetic polyneuropathy: Secondary | ICD-10-CM | POA: Diagnosis not present

## 2022-07-24 DIAGNOSIS — I10 Essential (primary) hypertension: Secondary | ICD-10-CM | POA: Diagnosis not present

## 2022-07-24 DIAGNOSIS — E1169 Type 2 diabetes mellitus with other specified complication: Secondary | ICD-10-CM | POA: Diagnosis not present

## 2022-07-24 DIAGNOSIS — Z89431 Acquired absence of right foot: Secondary | ICD-10-CM | POA: Diagnosis not present

## 2022-08-10 DIAGNOSIS — E11628 Type 2 diabetes mellitus with other skin complications: Secondary | ICD-10-CM | POA: Diagnosis not present

## 2022-08-10 DIAGNOSIS — L97514 Non-pressure chronic ulcer of other part of right foot with necrosis of bone: Secondary | ICD-10-CM | POA: Diagnosis not present

## 2022-08-10 DIAGNOSIS — I83019 Varicose veins of right lower extremity with ulcer of unspecified site: Secondary | ICD-10-CM | POA: Diagnosis not present

## 2022-08-10 DIAGNOSIS — Z89512 Acquired absence of left leg below knee: Secondary | ICD-10-CM | POA: Diagnosis not present

## 2022-08-10 DIAGNOSIS — E1142 Type 2 diabetes mellitus with diabetic polyneuropathy: Secondary | ICD-10-CM | POA: Diagnosis not present

## 2022-08-10 DIAGNOSIS — L089 Local infection of the skin and subcutaneous tissue, unspecified: Secondary | ICD-10-CM | POA: Diagnosis not present

## 2022-08-10 DIAGNOSIS — L97919 Non-pressure chronic ulcer of unspecified part of right lower leg with unspecified severity: Secondary | ICD-10-CM | POA: Diagnosis not present

## 2022-08-19 DIAGNOSIS — E11628 Type 2 diabetes mellitus with other skin complications: Secondary | ICD-10-CM | POA: Diagnosis not present

## 2022-08-19 DIAGNOSIS — T8131XD Disruption of external operation (surgical) wound, not elsewhere classified, subsequent encounter: Secondary | ICD-10-CM | POA: Diagnosis not present

## 2022-08-19 DIAGNOSIS — L089 Local infection of the skin and subcutaneous tissue, unspecified: Secondary | ICD-10-CM | POA: Diagnosis not present

## 2022-08-19 DIAGNOSIS — Z89512 Acquired absence of left leg below knee: Secondary | ICD-10-CM | POA: Diagnosis not present

## 2022-08-27 DIAGNOSIS — E11628 Type 2 diabetes mellitus with other skin complications: Secondary | ICD-10-CM | POA: Diagnosis not present

## 2022-08-27 DIAGNOSIS — L97514 Non-pressure chronic ulcer of other part of right foot with necrosis of bone: Secondary | ICD-10-CM | POA: Diagnosis not present

## 2022-08-27 DIAGNOSIS — L089 Local infection of the skin and subcutaneous tissue, unspecified: Secondary | ICD-10-CM | POA: Diagnosis not present

## 2022-09-14 DIAGNOSIS — Z89421 Acquired absence of other right toe(s): Secondary | ICD-10-CM | POA: Diagnosis not present

## 2022-09-14 DIAGNOSIS — T8130XA Disruption of wound, unspecified, initial encounter: Secondary | ICD-10-CM | POA: Diagnosis not present

## 2022-09-14 DIAGNOSIS — R234 Changes in skin texture: Secondary | ICD-10-CM | POA: Diagnosis not present

## 2022-09-14 DIAGNOSIS — Z89512 Acquired absence of left leg below knee: Secondary | ICD-10-CM | POA: Diagnosis not present

## 2022-09-14 DIAGNOSIS — E11621 Type 2 diabetes mellitus with foot ulcer: Secondary | ICD-10-CM | POA: Diagnosis not present

## 2022-09-14 DIAGNOSIS — L97512 Non-pressure chronic ulcer of other part of right foot with fat layer exposed: Secondary | ICD-10-CM | POA: Diagnosis not present

## 2022-09-14 DIAGNOSIS — E1142 Type 2 diabetes mellitus with diabetic polyneuropathy: Secondary | ICD-10-CM | POA: Diagnosis not present

## 2022-09-14 DIAGNOSIS — R600 Localized edema: Secondary | ICD-10-CM | POA: Diagnosis not present

## 2022-09-16 DIAGNOSIS — L97514 Non-pressure chronic ulcer of other part of right foot with necrosis of bone: Secondary | ICD-10-CM | POA: Diagnosis not present

## 2022-09-16 DIAGNOSIS — T8131XD Disruption of external operation (surgical) wound, not elsewhere classified, subsequent encounter: Secondary | ICD-10-CM | POA: Diagnosis not present

## 2022-09-23 DIAGNOSIS — L97514 Non-pressure chronic ulcer of other part of right foot with necrosis of bone: Secondary | ICD-10-CM | POA: Diagnosis not present

## 2022-09-30 DIAGNOSIS — L97514 Non-pressure chronic ulcer of other part of right foot with necrosis of bone: Secondary | ICD-10-CM | POA: Diagnosis not present

## 2022-10-01 NOTE — Telephone Encounter (Signed)
Error

## 2022-10-07 DIAGNOSIS — L97514 Non-pressure chronic ulcer of other part of right foot with necrosis of bone: Secondary | ICD-10-CM | POA: Diagnosis not present

## 2022-10-14 DIAGNOSIS — L97514 Non-pressure chronic ulcer of other part of right foot with necrosis of bone: Secondary | ICD-10-CM | POA: Diagnosis not present

## 2022-10-21 DIAGNOSIS — L97514 Non-pressure chronic ulcer of other part of right foot with necrosis of bone: Secondary | ICD-10-CM | POA: Diagnosis not present

## 2022-10-28 DIAGNOSIS — L97514 Non-pressure chronic ulcer of other part of right foot with necrosis of bone: Secondary | ICD-10-CM | POA: Diagnosis not present

## 2022-11-04 DIAGNOSIS — L97514 Non-pressure chronic ulcer of other part of right foot with necrosis of bone: Secondary | ICD-10-CM | POA: Diagnosis not present

## 2022-11-11 DIAGNOSIS — L97514 Non-pressure chronic ulcer of other part of right foot with necrosis of bone: Secondary | ICD-10-CM | POA: Diagnosis not present

## 2022-11-16 DIAGNOSIS — E11628 Type 2 diabetes mellitus with other skin complications: Secondary | ICD-10-CM | POA: Diagnosis not present

## 2022-11-16 DIAGNOSIS — L089 Local infection of the skin and subcutaneous tissue, unspecified: Secondary | ICD-10-CM | POA: Diagnosis not present

## 2022-11-16 DIAGNOSIS — A419 Sepsis, unspecified organism: Secondary | ICD-10-CM | POA: Diagnosis not present

## 2022-11-16 DIAGNOSIS — E11621 Type 2 diabetes mellitus with foot ulcer: Secondary | ICD-10-CM | POA: Diagnosis not present

## 2022-11-16 DIAGNOSIS — R509 Fever, unspecified: Secondary | ICD-10-CM | POA: Diagnosis not present

## 2022-11-16 DIAGNOSIS — G4733 Obstructive sleep apnea (adult) (pediatric): Secondary | ICD-10-CM | POA: Diagnosis not present

## 2022-11-16 DIAGNOSIS — K219 Gastro-esophageal reflux disease without esophagitis: Secondary | ICD-10-CM | POA: Diagnosis not present

## 2022-11-16 DIAGNOSIS — Z89421 Acquired absence of other right toe(s): Secondary | ICD-10-CM | POA: Diagnosis not present

## 2022-11-16 DIAGNOSIS — D62 Acute posthemorrhagic anemia: Secondary | ICD-10-CM | POA: Diagnosis not present

## 2022-11-16 DIAGNOSIS — R234 Changes in skin texture: Secondary | ICD-10-CM | POA: Diagnosis not present

## 2022-11-16 DIAGNOSIS — L97513 Non-pressure chronic ulcer of other part of right foot with necrosis of muscle: Secondary | ICD-10-CM | POA: Diagnosis not present

## 2022-11-16 DIAGNOSIS — L97512 Non-pressure chronic ulcer of other part of right foot with fat layer exposed: Secondary | ICD-10-CM | POA: Diagnosis not present

## 2022-11-16 DIAGNOSIS — R600 Localized edema: Secondary | ICD-10-CM | POA: Diagnosis not present

## 2022-11-16 DIAGNOSIS — I872 Venous insufficiency (chronic) (peripheral): Secondary | ICD-10-CM | POA: Diagnosis not present

## 2022-11-16 DIAGNOSIS — L97413 Non-pressure chronic ulcer of right heel and midfoot with necrosis of muscle: Secondary | ICD-10-CM | POA: Diagnosis not present

## 2022-11-16 DIAGNOSIS — L942 Calcinosis cutis: Secondary | ICD-10-CM | POA: Diagnosis not present

## 2022-11-16 DIAGNOSIS — Z89512 Acquired absence of left leg below knee: Secondary | ICD-10-CM | POA: Diagnosis not present

## 2022-11-16 DIAGNOSIS — E1169 Type 2 diabetes mellitus with other specified complication: Secondary | ICD-10-CM | POA: Diagnosis not present

## 2022-11-16 DIAGNOSIS — D509 Iron deficiency anemia, unspecified: Secondary | ICD-10-CM | POA: Diagnosis not present

## 2022-11-16 DIAGNOSIS — R000 Tachycardia, unspecified: Secondary | ICD-10-CM | POA: Diagnosis not present

## 2022-11-16 DIAGNOSIS — E1159 Type 2 diabetes mellitus with other circulatory complications: Secondary | ICD-10-CM | POA: Diagnosis not present

## 2022-11-16 DIAGNOSIS — E876 Hypokalemia: Secondary | ICD-10-CM | POA: Diagnosis not present

## 2022-11-16 DIAGNOSIS — D72829 Elevated white blood cell count, unspecified: Secondary | ICD-10-CM | POA: Diagnosis not present

## 2022-11-16 DIAGNOSIS — Z6841 Body Mass Index (BMI) 40.0 and over, adult: Secondary | ICD-10-CM | POA: Diagnosis not present

## 2022-11-16 DIAGNOSIS — E1142 Type 2 diabetes mellitus with diabetic polyneuropathy: Secondary | ICD-10-CM | POA: Diagnosis not present

## 2022-11-16 DIAGNOSIS — Z888 Allergy status to other drugs, medicaments and biological substances status: Secondary | ICD-10-CM | POA: Diagnosis not present

## 2022-11-16 DIAGNOSIS — T8130XA Disruption of wound, unspecified, initial encounter: Secondary | ICD-10-CM | POA: Diagnosis not present

## 2022-11-16 DIAGNOSIS — E871 Hypo-osmolality and hyponatremia: Secondary | ICD-10-CM | POA: Diagnosis not present

## 2022-11-16 DIAGNOSIS — E785 Hyperlipidemia, unspecified: Secondary | ICD-10-CM | POA: Diagnosis not present

## 2022-11-16 DIAGNOSIS — I10 Essential (primary) hypertension: Secondary | ICD-10-CM | POA: Diagnosis not present

## 2022-11-16 DIAGNOSIS — E1140 Type 2 diabetes mellitus with diabetic neuropathy, unspecified: Secondary | ICD-10-CM | POA: Diagnosis not present

## 2022-11-17 DIAGNOSIS — E11621 Type 2 diabetes mellitus with foot ulcer: Secondary | ICD-10-CM | POA: Diagnosis not present

## 2022-11-17 DIAGNOSIS — Z794 Long term (current) use of insulin: Secondary | ICD-10-CM | POA: Diagnosis not present

## 2022-11-17 DIAGNOSIS — E871 Hypo-osmolality and hyponatremia: Secondary | ICD-10-CM | POA: Diagnosis not present

## 2022-11-17 DIAGNOSIS — L089 Local infection of the skin and subcutaneous tissue, unspecified: Secondary | ICD-10-CM | POA: Diagnosis not present

## 2022-11-17 DIAGNOSIS — A419 Sepsis, unspecified organism: Secondary | ICD-10-CM | POA: Diagnosis not present

## 2022-11-17 DIAGNOSIS — G4730 Sleep apnea, unspecified: Secondary | ICD-10-CM | POA: Diagnosis not present

## 2022-11-17 DIAGNOSIS — E1142 Type 2 diabetes mellitus with diabetic polyneuropathy: Secondary | ICD-10-CM | POA: Diagnosis not present

## 2022-11-17 DIAGNOSIS — D62 Acute posthemorrhagic anemia: Secondary | ICD-10-CM | POA: Diagnosis not present

## 2022-11-17 DIAGNOSIS — E1159 Type 2 diabetes mellitus with other circulatory complications: Secondary | ICD-10-CM | POA: Diagnosis not present

## 2022-11-17 DIAGNOSIS — I152 Hypertension secondary to endocrine disorders: Secondary | ICD-10-CM | POA: Diagnosis not present

## 2022-11-17 DIAGNOSIS — K219 Gastro-esophageal reflux disease without esophagitis: Secondary | ICD-10-CM | POA: Diagnosis not present

## 2022-11-17 DIAGNOSIS — E11628 Type 2 diabetes mellitus with other skin complications: Secondary | ICD-10-CM | POA: Diagnosis not present

## 2022-11-17 DIAGNOSIS — Z6841 Body Mass Index (BMI) 40.0 and over, adult: Secondary | ICD-10-CM | POA: Diagnosis not present

## 2022-11-17 DIAGNOSIS — I10 Essential (primary) hypertension: Secondary | ICD-10-CM | POA: Diagnosis not present

## 2022-11-18 DIAGNOSIS — I152 Hypertension secondary to endocrine disorders: Secondary | ICD-10-CM | POA: Diagnosis not present

## 2022-11-18 DIAGNOSIS — E1142 Type 2 diabetes mellitus with diabetic polyneuropathy: Secondary | ICD-10-CM | POA: Diagnosis not present

## 2022-11-18 DIAGNOSIS — E871 Hypo-osmolality and hyponatremia: Secondary | ICD-10-CM | POA: Diagnosis not present

## 2022-11-18 DIAGNOSIS — E11628 Type 2 diabetes mellitus with other skin complications: Secondary | ICD-10-CM | POA: Diagnosis not present

## 2022-11-18 DIAGNOSIS — E1159 Type 2 diabetes mellitus with other circulatory complications: Secondary | ICD-10-CM | POA: Diagnosis not present

## 2022-11-18 DIAGNOSIS — Z794 Long term (current) use of insulin: Secondary | ICD-10-CM | POA: Diagnosis not present

## 2022-11-18 DIAGNOSIS — L089 Local infection of the skin and subcutaneous tissue, unspecified: Secondary | ICD-10-CM | POA: Diagnosis not present

## 2022-11-19 DIAGNOSIS — E11628 Type 2 diabetes mellitus with other skin complications: Secondary | ICD-10-CM | POA: Diagnosis not present

## 2022-11-19 DIAGNOSIS — Z794 Long term (current) use of insulin: Secondary | ICD-10-CM | POA: Diagnosis not present

## 2022-11-19 DIAGNOSIS — E1159 Type 2 diabetes mellitus with other circulatory complications: Secondary | ICD-10-CM | POA: Diagnosis not present

## 2022-11-19 DIAGNOSIS — E1142 Type 2 diabetes mellitus with diabetic polyneuropathy: Secondary | ICD-10-CM | POA: Diagnosis not present

## 2022-11-19 DIAGNOSIS — L089 Local infection of the skin and subcutaneous tissue, unspecified: Secondary | ICD-10-CM | POA: Diagnosis not present

## 2022-11-19 DIAGNOSIS — I152 Hypertension secondary to endocrine disorders: Secondary | ICD-10-CM | POA: Diagnosis not present

## 2022-11-19 DIAGNOSIS — E871 Hypo-osmolality and hyponatremia: Secondary | ICD-10-CM | POA: Diagnosis not present

## 2022-11-20 DIAGNOSIS — E11628 Type 2 diabetes mellitus with other skin complications: Secondary | ICD-10-CM | POA: Diagnosis not present

## 2022-11-20 DIAGNOSIS — I10 Essential (primary) hypertension: Secondary | ICD-10-CM | POA: Diagnosis not present

## 2022-11-20 DIAGNOSIS — E871 Hypo-osmolality and hyponatremia: Secondary | ICD-10-CM | POA: Diagnosis not present

## 2022-11-20 DIAGNOSIS — Z6841 Body Mass Index (BMI) 40.0 and over, adult: Secondary | ICD-10-CM | POA: Diagnosis not present

## 2022-11-20 DIAGNOSIS — E1159 Type 2 diabetes mellitus with other circulatory complications: Secondary | ICD-10-CM | POA: Diagnosis not present

## 2022-11-20 DIAGNOSIS — D62 Acute posthemorrhagic anemia: Secondary | ICD-10-CM | POA: Diagnosis not present

## 2022-11-20 DIAGNOSIS — S81801A Unspecified open wound, right lower leg, initial encounter: Secondary | ICD-10-CM | POA: Diagnosis not present

## 2022-11-20 DIAGNOSIS — L089 Local infection of the skin and subcutaneous tissue, unspecified: Secondary | ICD-10-CM | POA: Diagnosis not present

## 2022-11-20 DIAGNOSIS — Z794 Long term (current) use of insulin: Secondary | ICD-10-CM | POA: Diagnosis not present

## 2022-11-20 DIAGNOSIS — A419 Sepsis, unspecified organism: Secondary | ICD-10-CM | POA: Diagnosis not present

## 2022-11-20 DIAGNOSIS — E1142 Type 2 diabetes mellitus with diabetic polyneuropathy: Secondary | ICD-10-CM | POA: Diagnosis not present

## 2022-11-20 DIAGNOSIS — G4733 Obstructive sleep apnea (adult) (pediatric): Secondary | ICD-10-CM | POA: Diagnosis not present

## 2022-11-20 DIAGNOSIS — Z89511 Acquired absence of right leg below knee: Secondary | ICD-10-CM | POA: Diagnosis not present

## 2022-11-20 DIAGNOSIS — I152 Hypertension secondary to endocrine disorders: Secondary | ICD-10-CM | POA: Diagnosis not present

## 2022-11-21 DIAGNOSIS — E11628 Type 2 diabetes mellitus with other skin complications: Secondary | ICD-10-CM | POA: Diagnosis not present

## 2022-11-21 DIAGNOSIS — Z794 Long term (current) use of insulin: Secondary | ICD-10-CM | POA: Diagnosis not present

## 2022-11-21 DIAGNOSIS — E871 Hypo-osmolality and hyponatremia: Secondary | ICD-10-CM | POA: Diagnosis not present

## 2022-11-21 DIAGNOSIS — E1159 Type 2 diabetes mellitus with other circulatory complications: Secondary | ICD-10-CM | POA: Diagnosis not present

## 2022-11-21 DIAGNOSIS — E1142 Type 2 diabetes mellitus with diabetic polyneuropathy: Secondary | ICD-10-CM | POA: Diagnosis not present

## 2022-11-21 DIAGNOSIS — I152 Hypertension secondary to endocrine disorders: Secondary | ICD-10-CM | POA: Diagnosis not present

## 2022-11-21 DIAGNOSIS — L089 Local infection of the skin and subcutaneous tissue, unspecified: Secondary | ICD-10-CM | POA: Diagnosis not present

## 2022-11-22 DIAGNOSIS — Z794 Long term (current) use of insulin: Secondary | ICD-10-CM | POA: Diagnosis not present

## 2022-11-22 DIAGNOSIS — E1159 Type 2 diabetes mellitus with other circulatory complications: Secondary | ICD-10-CM | POA: Diagnosis not present

## 2022-11-22 DIAGNOSIS — E871 Hypo-osmolality and hyponatremia: Secondary | ICD-10-CM | POA: Diagnosis not present

## 2022-11-22 DIAGNOSIS — I152 Hypertension secondary to endocrine disorders: Secondary | ICD-10-CM | POA: Diagnosis not present

## 2022-11-22 DIAGNOSIS — E1142 Type 2 diabetes mellitus with diabetic polyneuropathy: Secondary | ICD-10-CM | POA: Diagnosis not present

## 2022-11-22 DIAGNOSIS — L089 Local infection of the skin and subcutaneous tissue, unspecified: Secondary | ICD-10-CM | POA: Diagnosis not present

## 2022-11-22 DIAGNOSIS — E11628 Type 2 diabetes mellitus with other skin complications: Secondary | ICD-10-CM | POA: Diagnosis not present

## 2022-11-23 DIAGNOSIS — Z794 Long term (current) use of insulin: Secondary | ICD-10-CM | POA: Diagnosis not present

## 2022-11-23 DIAGNOSIS — I152 Hypertension secondary to endocrine disorders: Secondary | ICD-10-CM | POA: Diagnosis not present

## 2022-11-23 DIAGNOSIS — E1159 Type 2 diabetes mellitus with other circulatory complications: Secondary | ICD-10-CM | POA: Diagnosis not present

## 2022-11-23 DIAGNOSIS — E871 Hypo-osmolality and hyponatremia: Secondary | ICD-10-CM | POA: Diagnosis not present

## 2022-11-23 DIAGNOSIS — E11628 Type 2 diabetes mellitus with other skin complications: Secondary | ICD-10-CM | POA: Diagnosis not present

## 2022-11-23 DIAGNOSIS — E1142 Type 2 diabetes mellitus with diabetic polyneuropathy: Secondary | ICD-10-CM | POA: Diagnosis not present

## 2022-11-23 DIAGNOSIS — L089 Local infection of the skin and subcutaneous tissue, unspecified: Secondary | ICD-10-CM | POA: Diagnosis not present

## 2022-11-24 DIAGNOSIS — D509 Iron deficiency anemia, unspecified: Secondary | ICD-10-CM | POA: Diagnosis not present

## 2022-11-24 DIAGNOSIS — E1159 Type 2 diabetes mellitus with other circulatory complications: Secondary | ICD-10-CM | POA: Diagnosis not present

## 2022-11-24 DIAGNOSIS — L089 Local infection of the skin and subcutaneous tissue, unspecified: Secondary | ICD-10-CM | POA: Diagnosis not present

## 2022-11-24 DIAGNOSIS — Z794 Long term (current) use of insulin: Secondary | ICD-10-CM | POA: Diagnosis not present

## 2022-11-24 DIAGNOSIS — E11628 Type 2 diabetes mellitus with other skin complications: Secondary | ICD-10-CM | POA: Diagnosis not present

## 2022-11-24 DIAGNOSIS — E1142 Type 2 diabetes mellitus with diabetic polyneuropathy: Secondary | ICD-10-CM | POA: Diagnosis not present

## 2022-11-24 DIAGNOSIS — I152 Hypertension secondary to endocrine disorders: Secondary | ICD-10-CM | POA: Diagnosis not present

## 2022-11-25 DIAGNOSIS — E1142 Type 2 diabetes mellitus with diabetic polyneuropathy: Secondary | ICD-10-CM | POA: Diagnosis not present

## 2022-11-25 DIAGNOSIS — E11628 Type 2 diabetes mellitus with other skin complications: Secondary | ICD-10-CM | POA: Diagnosis not present

## 2022-11-25 DIAGNOSIS — Z794 Long term (current) use of insulin: Secondary | ICD-10-CM | POA: Diagnosis not present

## 2022-11-25 DIAGNOSIS — D509 Iron deficiency anemia, unspecified: Secondary | ICD-10-CM | POA: Diagnosis not present

## 2022-11-25 DIAGNOSIS — E1159 Type 2 diabetes mellitus with other circulatory complications: Secondary | ICD-10-CM | POA: Diagnosis not present

## 2022-11-25 DIAGNOSIS — I152 Hypertension secondary to endocrine disorders: Secondary | ICD-10-CM | POA: Diagnosis not present

## 2022-11-25 DIAGNOSIS — L089 Local infection of the skin and subcutaneous tissue, unspecified: Secondary | ICD-10-CM | POA: Diagnosis not present

## 2023-10-20 DIAGNOSIS — E119 Type 2 diabetes mellitus without complications: Secondary | ICD-10-CM | POA: Diagnosis not present

## 2023-10-20 DIAGNOSIS — E1165 Type 2 diabetes mellitus with hyperglycemia: Secondary | ICD-10-CM | POA: Diagnosis not present

## 2023-10-20 DIAGNOSIS — Z79899 Other long term (current) drug therapy: Secondary | ICD-10-CM | POA: Diagnosis not present

## 2023-10-20 DIAGNOSIS — Z794 Long term (current) use of insulin: Secondary | ICD-10-CM | POA: Diagnosis not present

## 2023-10-20 DIAGNOSIS — Z89511 Acquired absence of right leg below knee: Secondary | ICD-10-CM | POA: Diagnosis not present

## 2023-10-20 DIAGNOSIS — Z89512 Acquired absence of left leg below knee: Secondary | ICD-10-CM | POA: Diagnosis not present

## 2023-10-20 DIAGNOSIS — E7800 Pure hypercholesterolemia, unspecified: Secondary | ICD-10-CM | POA: Diagnosis not present

## 2024-01-17 DIAGNOSIS — E1169 Type 2 diabetes mellitus with other specified complication: Secondary | ICD-10-CM | POA: Diagnosis not present

## 2024-01-17 DIAGNOSIS — E1165 Type 2 diabetes mellitus with hyperglycemia: Secondary | ICD-10-CM | POA: Diagnosis not present

## 2024-01-17 DIAGNOSIS — E119 Type 2 diabetes mellitus without complications: Secondary | ICD-10-CM | POA: Diagnosis not present

## 2024-01-17 DIAGNOSIS — I10 Essential (primary) hypertension: Secondary | ICD-10-CM | POA: Diagnosis not present

## 2024-01-31 DIAGNOSIS — E1165 Type 2 diabetes mellitus with hyperglycemia: Secondary | ICD-10-CM | POA: Diagnosis not present

## 2024-01-31 DIAGNOSIS — E119 Type 2 diabetes mellitus without complications: Secondary | ICD-10-CM | POA: Diagnosis not present

## 2024-01-31 DIAGNOSIS — E1169 Type 2 diabetes mellitus with other specified complication: Secondary | ICD-10-CM | POA: Diagnosis not present

## 2024-01-31 DIAGNOSIS — E785 Hyperlipidemia, unspecified: Secondary | ICD-10-CM | POA: Diagnosis not present

## 2024-02-09 DIAGNOSIS — Z89511 Acquired absence of right leg below knee: Secondary | ICD-10-CM | POA: Diagnosis not present

## 2024-02-09 DIAGNOSIS — Z79899 Other long term (current) drug therapy: Secondary | ICD-10-CM | POA: Diagnosis not present

## 2024-02-09 DIAGNOSIS — Z89512 Acquired absence of left leg below knee: Secondary | ICD-10-CM | POA: Diagnosis not present

## 2024-02-09 DIAGNOSIS — Z794 Long term (current) use of insulin: Secondary | ICD-10-CM | POA: Diagnosis not present

## 2024-02-09 DIAGNOSIS — E1165 Type 2 diabetes mellitus with hyperglycemia: Secondary | ICD-10-CM | POA: Diagnosis not present

## 2024-02-09 DIAGNOSIS — I10 Essential (primary) hypertension: Secondary | ICD-10-CM | POA: Diagnosis not present

## 2024-02-09 DIAGNOSIS — E7800 Pure hypercholesterolemia, unspecified: Secondary | ICD-10-CM | POA: Diagnosis not present

## 2024-02-15 DIAGNOSIS — E1169 Type 2 diabetes mellitus with other specified complication: Secondary | ICD-10-CM | POA: Diagnosis not present

## 2024-02-15 DIAGNOSIS — I10 Essential (primary) hypertension: Secondary | ICD-10-CM | POA: Diagnosis not present

## 2024-02-15 DIAGNOSIS — E119 Type 2 diabetes mellitus without complications: Secondary | ICD-10-CM | POA: Diagnosis not present

## 2024-02-15 DIAGNOSIS — E1165 Type 2 diabetes mellitus with hyperglycemia: Secondary | ICD-10-CM | POA: Diagnosis not present

## 2024-03-02 DIAGNOSIS — I10 Essential (primary) hypertension: Secondary | ICD-10-CM | POA: Diagnosis not present

## 2024-03-02 DIAGNOSIS — E119 Type 2 diabetes mellitus without complications: Secondary | ICD-10-CM | POA: Diagnosis not present

## 2024-03-02 DIAGNOSIS — E1165 Type 2 diabetes mellitus with hyperglycemia: Secondary | ICD-10-CM | POA: Diagnosis not present

## 2024-03-02 DIAGNOSIS — E785 Hyperlipidemia, unspecified: Secondary | ICD-10-CM | POA: Diagnosis not present

## 2024-03-02 DIAGNOSIS — E1169 Type 2 diabetes mellitus with other specified complication: Secondary | ICD-10-CM | POA: Diagnosis not present

## 2024-03-16 DIAGNOSIS — E119 Type 2 diabetes mellitus without complications: Secondary | ICD-10-CM | POA: Diagnosis not present

## 2024-03-16 DIAGNOSIS — I10 Essential (primary) hypertension: Secondary | ICD-10-CM | POA: Diagnosis not present

## 2024-03-16 DIAGNOSIS — E1165 Type 2 diabetes mellitus with hyperglycemia: Secondary | ICD-10-CM | POA: Diagnosis not present

## 2024-03-16 DIAGNOSIS — E1169 Type 2 diabetes mellitus with other specified complication: Secondary | ICD-10-CM | POA: Diagnosis not present

## 2024-04-02 DIAGNOSIS — I10 Essential (primary) hypertension: Secondary | ICD-10-CM | POA: Diagnosis not present

## 2024-04-02 DIAGNOSIS — E119 Type 2 diabetes mellitus without complications: Secondary | ICD-10-CM | POA: Diagnosis not present

## 2024-04-02 DIAGNOSIS — E785 Hyperlipidemia, unspecified: Secondary | ICD-10-CM | POA: Diagnosis not present

## 2024-04-02 DIAGNOSIS — E1165 Type 2 diabetes mellitus with hyperglycemia: Secondary | ICD-10-CM | POA: Diagnosis not present

## 2024-04-02 DIAGNOSIS — E1169 Type 2 diabetes mellitus with other specified complication: Secondary | ICD-10-CM | POA: Diagnosis not present

## 2024-04-15 DIAGNOSIS — E1165 Type 2 diabetes mellitus with hyperglycemia: Secondary | ICD-10-CM | POA: Diagnosis not present

## 2024-04-15 DIAGNOSIS — I10 Essential (primary) hypertension: Secondary | ICD-10-CM | POA: Diagnosis not present

## 2024-04-15 DIAGNOSIS — E119 Type 2 diabetes mellitus without complications: Secondary | ICD-10-CM | POA: Diagnosis not present

## 2024-04-15 DIAGNOSIS — E1169 Type 2 diabetes mellitus with other specified complication: Secondary | ICD-10-CM | POA: Diagnosis not present

## 2024-04-25 DIAGNOSIS — Z89512 Acquired absence of left leg below knee: Secondary | ICD-10-CM | POA: Diagnosis not present

## 2024-04-25 DIAGNOSIS — Z89511 Acquired absence of right leg below knee: Secondary | ICD-10-CM | POA: Diagnosis not present

## 2024-05-08 DIAGNOSIS — E7800 Pure hypercholesterolemia, unspecified: Secondary | ICD-10-CM | POA: Diagnosis not present

## 2024-05-08 DIAGNOSIS — E1165 Type 2 diabetes mellitus with hyperglycemia: Secondary | ICD-10-CM | POA: Diagnosis not present

## 2024-05-08 DIAGNOSIS — Z1211 Encounter for screening for malignant neoplasm of colon: Secondary | ICD-10-CM | POA: Diagnosis not present

## 2024-05-08 DIAGNOSIS — D649 Anemia, unspecified: Secondary | ICD-10-CM | POA: Diagnosis not present

## 2024-05-08 DIAGNOSIS — Z89511 Acquired absence of right leg below knee: Secondary | ICD-10-CM | POA: Diagnosis not present

## 2024-05-08 DIAGNOSIS — I10 Essential (primary) hypertension: Secondary | ICD-10-CM | POA: Diagnosis not present

## 2024-05-08 DIAGNOSIS — R1900 Intra-abdominal and pelvic swelling, mass and lump, unspecified site: Secondary | ICD-10-CM | POA: Diagnosis not present

## 2024-05-08 DIAGNOSIS — Z79899 Other long term (current) drug therapy: Secondary | ICD-10-CM | POA: Diagnosis not present

## 2024-05-08 DIAGNOSIS — Z0000 Encounter for general adult medical examination without abnormal findings: Secondary | ICD-10-CM | POA: Diagnosis not present

## 2024-05-08 DIAGNOSIS — Z89512 Acquired absence of left leg below knee: Secondary | ICD-10-CM | POA: Diagnosis not present

## 2024-05-09 ENCOUNTER — Other Ambulatory Visit: Payer: Self-pay | Admitting: Family Medicine

## 2024-05-09 DIAGNOSIS — R1900 Intra-abdominal and pelvic swelling, mass and lump, unspecified site: Secondary | ICD-10-CM

## 2024-05-17 ENCOUNTER — Encounter (INDEPENDENT_AMBULATORY_CARE_PROVIDER_SITE_OTHER): Payer: Self-pay
